# Patient Record
Sex: Male | Born: 1940 | Race: White | Hispanic: No | Marital: Married | Smoking: Former smoker
Health system: Southern US, Community
[De-identification: ages and names within clinical notes are randomized; demographics above are authoritative.]

## PROBLEM LIST (undated history)

## (undated) DIAGNOSIS — Z974 Presence of external hearing-aid: Secondary | ICD-10-CM

## (undated) DIAGNOSIS — I2510 Atherosclerotic heart disease of native coronary artery without angina pectoris: Secondary | ICD-10-CM

## (undated) DIAGNOSIS — M1990 Unspecified osteoarthritis, unspecified site: Secondary | ICD-10-CM

## (undated) DIAGNOSIS — Z872 Personal history of diseases of the skin and subcutaneous tissue: Secondary | ICD-10-CM

## (undated) DIAGNOSIS — I739 Peripheral vascular disease, unspecified: Secondary | ICD-10-CM

## (undated) DIAGNOSIS — Z9989 Dependence on other enabling machines and devices: Secondary | ICD-10-CM

## (undated) DIAGNOSIS — R351 Nocturia: Secondary | ICD-10-CM

## (undated) DIAGNOSIS — C61 Malignant neoplasm of prostate: Secondary | ICD-10-CM

## (undated) DIAGNOSIS — Z87828 Personal history of other (healed) physical injury and trauma: Secondary | ICD-10-CM

## (undated) DIAGNOSIS — R600 Localized edema: Secondary | ICD-10-CM

## (undated) DIAGNOSIS — I493 Ventricular premature depolarization: Secondary | ICD-10-CM

## (undated) DIAGNOSIS — E119 Type 2 diabetes mellitus without complications: Secondary | ICD-10-CM

## (undated) DIAGNOSIS — E039 Hypothyroidism, unspecified: Secondary | ICD-10-CM

## (undated) DIAGNOSIS — G4733 Obstructive sleep apnea (adult) (pediatric): Secondary | ICD-10-CM

## (undated) DIAGNOSIS — N320 Bladder-neck obstruction: Secondary | ICD-10-CM

## (undated) DIAGNOSIS — E785 Hyperlipidemia, unspecified: Secondary | ICD-10-CM

## (undated) DIAGNOSIS — Z973 Presence of spectacles and contact lenses: Secondary | ICD-10-CM

## (undated) DIAGNOSIS — H269 Unspecified cataract: Secondary | ICD-10-CM

## (undated) DIAGNOSIS — Z8719 Personal history of other diseases of the digestive system: Secondary | ICD-10-CM

## (undated) DIAGNOSIS — G4730 Sleep apnea, unspecified: Secondary | ICD-10-CM

## (undated) DIAGNOSIS — I10 Essential (primary) hypertension: Secondary | ICD-10-CM

## (undated) DIAGNOSIS — K219 Gastro-esophageal reflux disease without esophagitis: Secondary | ICD-10-CM

## (undated) HISTORY — PX: KNEE ARTHROSCOPY: SHX127

## (undated) HISTORY — PX: TRANSTHORACIC ECHOCARDIOGRAM: SHX275

## (undated) HISTORY — DX: Hyperlipidemia, unspecified: E78.5

## (undated) HISTORY — DX: Hypothyroidism, unspecified: E03.9

## (undated) HISTORY — PX: POLYPECTOMY: SHX149

## (undated) HISTORY — DX: Unspecified cataract: H26.9

## (undated) HISTORY — PX: CARDIOVASCULAR STRESS TEST: SHX262

## (undated) HISTORY — DX: Sleep apnea, unspecified: G47.30

## (undated) HISTORY — DX: Atherosclerotic heart disease of native coronary artery without angina pectoris: I25.10

## (undated) HISTORY — DX: Essential (primary) hypertension: I10

---

## 1993-12-30 HISTORY — PX: CARDIAC CATHETERIZATION: SHX172

## 2002-08-25 ENCOUNTER — Ambulatory Visit (HOSPITAL_COMMUNITY): Admission: RE | Admit: 2002-08-25 | Discharge: 2002-08-25 | Payer: Self-pay | Admitting: Family Medicine

## 2005-10-14 ENCOUNTER — Inpatient Hospital Stay (HOSPITAL_COMMUNITY): Admission: EM | Admit: 2005-10-14 | Discharge: 2005-11-02 | Payer: Self-pay | Admitting: Emergency Medicine

## 2005-10-21 ENCOUNTER — Ambulatory Visit: Payer: Self-pay | Admitting: Critical Care Medicine

## 2005-11-20 ENCOUNTER — Encounter: Admission: RE | Admit: 2005-11-20 | Discharge: 2005-12-29 | Payer: Self-pay | Admitting: Family Medicine

## 2005-11-29 ENCOUNTER — Inpatient Hospital Stay (HOSPITAL_COMMUNITY): Admission: EM | Admit: 2005-11-29 | Discharge: 2005-12-01 | Payer: Self-pay | Admitting: Emergency Medicine

## 2005-11-29 HISTORY — PX: OTHER SURGICAL HISTORY: SHX169

## 2005-12-02 ENCOUNTER — Ambulatory Visit: Payer: Self-pay | Admitting: Gastroenterology

## 2005-12-04 ENCOUNTER — Ambulatory Visit: Payer: Self-pay | Admitting: *Deleted

## 2005-12-11 ENCOUNTER — Encounter: Admission: RE | Admit: 2005-12-11 | Discharge: 2005-12-11 | Payer: Self-pay | Admitting: Gastroenterology

## 2005-12-27 ENCOUNTER — Encounter: Admission: RE | Admit: 2005-12-27 | Discharge: 2005-12-27 | Payer: Self-pay | Admitting: Surgery

## 2006-03-30 HISTORY — PX: CHOLECYSTECTOMY OPEN: SUR202

## 2006-07-23 ENCOUNTER — Ambulatory Visit: Payer: Self-pay | Admitting: Internal Medicine

## 2006-08-25 ENCOUNTER — Ambulatory Visit: Payer: Self-pay | Admitting: Internal Medicine

## 2006-08-28 ENCOUNTER — Ambulatory Visit (HOSPITAL_BASED_OUTPATIENT_CLINIC_OR_DEPARTMENT_OTHER): Admission: RE | Admit: 2006-08-28 | Discharge: 2006-08-28 | Payer: Self-pay | Admitting: Surgery

## 2006-08-28 ENCOUNTER — Encounter (INDEPENDENT_AMBULATORY_CARE_PROVIDER_SITE_OTHER): Payer: Self-pay | Admitting: Specialist

## 2006-09-24 ENCOUNTER — Ambulatory Visit: Payer: Self-pay | Admitting: Internal Medicine

## 2006-09-28 HISTORY — PX: OTHER SURGICAL HISTORY: SHX169

## 2006-10-24 ENCOUNTER — Ambulatory Visit: Payer: Self-pay | Admitting: Internal Medicine

## 2006-11-10 ENCOUNTER — Emergency Department (HOSPITAL_COMMUNITY): Admission: EM | Admit: 2006-11-10 | Discharge: 2006-11-10 | Payer: Self-pay | Admitting: Emergency Medicine

## 2006-11-26 ENCOUNTER — Ambulatory Visit: Payer: Self-pay | Admitting: Internal Medicine

## 2006-11-29 HISTORY — PX: ABDOMINAL HERNIA REPAIR: SHX539

## 2007-01-13 ENCOUNTER — Ambulatory Visit: Payer: Self-pay | Admitting: Internal Medicine

## 2007-02-11 ENCOUNTER — Ambulatory Visit: Payer: Self-pay | Admitting: Internal Medicine

## 2007-02-11 LAB — CONVERTED CEMR LAB
ALT: 24 units/L (ref 0–40)
AST: 20 units/L (ref 0–37)
BUN: 18 mg/dL (ref 6–23)
CO2: 28 meq/L (ref 19–32)
GFR calc Af Amer: 125 mL/min
GFR calc non Af Amer: 103 mL/min
Hgb A1c MFr Bld: 7.2 %
LDL Cholesterol: 41 mg/dL (ref 0–99)
Microalb, Ur: 0.4 mg/dL (ref 0.0–1.9)
Potassium: 4.1 meq/L (ref 3.5–5.1)
Total CHOL/HDL Ratio: 3.3
Triglycerides: 58 mg/dL (ref 0–149)

## 2007-03-03 ENCOUNTER — Ambulatory Visit: Payer: Self-pay | Admitting: Internal Medicine

## 2007-03-24 ENCOUNTER — Encounter: Admission: RE | Admit: 2007-03-24 | Discharge: 2007-03-24 | Payer: Self-pay | Admitting: Orthopedic Surgery

## 2007-03-29 DIAGNOSIS — E1165 Type 2 diabetes mellitus with hyperglycemia: Secondary | ICD-10-CM

## 2007-04-28 ENCOUNTER — Ambulatory Visit: Payer: Self-pay | Admitting: Internal Medicine

## 2007-06-01 ENCOUNTER — Ambulatory Visit: Payer: Self-pay | Admitting: Internal Medicine

## 2007-06-07 ENCOUNTER — Emergency Department (HOSPITAL_COMMUNITY): Admission: EM | Admit: 2007-06-07 | Discharge: 2007-06-07 | Payer: Self-pay | Admitting: Emergency Medicine

## 2007-10-03 ENCOUNTER — Encounter: Payer: Self-pay | Admitting: Internal Medicine

## 2007-10-03 DIAGNOSIS — I2510 Atherosclerotic heart disease of native coronary artery without angina pectoris: Secondary | ICD-10-CM | POA: Insufficient documentation

## 2007-10-03 DIAGNOSIS — E785 Hyperlipidemia, unspecified: Secondary | ICD-10-CM | POA: Insufficient documentation

## 2007-10-03 DIAGNOSIS — I10 Essential (primary) hypertension: Secondary | ICD-10-CM | POA: Insufficient documentation

## 2007-12-29 ENCOUNTER — Ambulatory Visit: Payer: Self-pay | Admitting: Internal Medicine

## 2007-12-29 LAB — CONVERTED CEMR LAB
Albumin: 3.6 g/dL (ref 3.5–5.2)
Alkaline Phosphatase: 63 units/L (ref 39–117)
BUN: 20 mg/dL (ref 6–23)
Creatinine, Ser: 0.9 mg/dL (ref 0.4–1.5)
GFR calc Af Amer: 109 mL/min
Hgb A1c MFr Bld: 9.3 % — ABNORMAL HIGH (ref 4.6–6.0)
Microalb Creat Ratio: 7.6 mg/g (ref 0.0–30.0)
Microalb, Ur: 0.6 mg/dL (ref 0.0–1.9)
PSA: 0.75 ng/mL (ref 0.10–4.00)
Potassium: 4.2 meq/L (ref 3.5–5.1)
TSH: 2.41 microintl units/mL (ref 0.35–5.50)

## 2008-01-01 ENCOUNTER — Encounter: Payer: Self-pay | Admitting: Internal Medicine

## 2008-01-19 ENCOUNTER — Ambulatory Visit: Payer: Self-pay | Admitting: Gastroenterology

## 2008-01-20 ENCOUNTER — Ambulatory Visit: Payer: Self-pay | Admitting: Cardiology

## 2008-01-20 ENCOUNTER — Inpatient Hospital Stay (HOSPITAL_COMMUNITY): Admission: EM | Admit: 2008-01-20 | Discharge: 2008-02-01 | Payer: Self-pay | Admitting: Emergency Medicine

## 2008-01-21 HISTORY — PX: OTHER SURGICAL HISTORY: SHX169

## 2008-01-22 ENCOUNTER — Encounter (INDEPENDENT_AMBULATORY_CARE_PROVIDER_SITE_OTHER): Payer: Self-pay | Admitting: General Surgery

## 2008-01-26 ENCOUNTER — Telehealth: Payer: Self-pay | Admitting: Internal Medicine

## 2008-01-27 ENCOUNTER — Ambulatory Visit: Payer: Self-pay | Admitting: Physical Medicine & Rehabilitation

## 2008-02-01 ENCOUNTER — Inpatient Hospital Stay (HOSPITAL_COMMUNITY)
Admission: RE | Admit: 2008-02-01 | Discharge: 2008-02-11 | Payer: Self-pay | Admitting: Physical Medicine & Rehabilitation

## 2008-02-11 ENCOUNTER — Encounter: Payer: Self-pay | Admitting: Internal Medicine

## 2008-02-14 ENCOUNTER — Ambulatory Visit: Payer: Self-pay | Admitting: Internal Medicine

## 2008-02-14 ENCOUNTER — Encounter: Payer: Self-pay | Admitting: Internal Medicine

## 2008-02-14 ENCOUNTER — Observation Stay (HOSPITAL_COMMUNITY): Admission: EM | Admit: 2008-02-14 | Discharge: 2008-02-15 | Payer: Self-pay | Admitting: Emergency Medicine

## 2008-02-18 ENCOUNTER — Telehealth: Payer: Self-pay | Admitting: Internal Medicine

## 2008-02-24 ENCOUNTER — Telehealth: Payer: Self-pay | Admitting: Internal Medicine

## 2008-02-24 ENCOUNTER — Ambulatory Visit: Payer: Self-pay | Admitting: Internal Medicine

## 2008-02-24 DIAGNOSIS — R0602 Shortness of breath: Secondary | ICD-10-CM

## 2008-03-25 ENCOUNTER — Encounter: Payer: Self-pay | Admitting: Internal Medicine

## 2008-04-25 ENCOUNTER — Ambulatory Visit: Payer: Self-pay | Admitting: Internal Medicine

## 2008-04-25 DIAGNOSIS — M25569 Pain in unspecified knee: Secondary | ICD-10-CM

## 2008-04-27 ENCOUNTER — Telehealth: Payer: Self-pay | Admitting: Internal Medicine

## 2008-05-06 ENCOUNTER — Encounter: Admission: RE | Admit: 2008-05-06 | Discharge: 2008-05-06 | Payer: Self-pay | Admitting: Orthopedic Surgery

## 2008-05-20 ENCOUNTER — Ambulatory Visit: Payer: Self-pay | Admitting: Internal Medicine

## 2008-05-25 ENCOUNTER — Telehealth: Payer: Self-pay | Admitting: Internal Medicine

## 2008-05-25 ENCOUNTER — Ambulatory Visit: Payer: Self-pay | Admitting: Internal Medicine

## 2008-05-25 DIAGNOSIS — R609 Edema, unspecified: Secondary | ICD-10-CM

## 2008-05-25 LAB — CONVERTED CEMR LAB
ALT: 18 units/L (ref 0–53)
AST: 14 units/L (ref 0–37)
CO2: 31 meq/L (ref 19–32)
GFR calc Af Amer: 124 mL/min
Glucose, Bld: 82 mg/dL (ref 70–99)
Microalb Creat Ratio: 13.9 mg/g (ref 0.0–30.0)
Microalb, Ur: 1.7 mg/dL (ref 0.0–1.9)
Potassium: 4 meq/L (ref 3.5–5.1)
Sodium: 138 meq/L (ref 135–145)

## 2008-05-26 ENCOUNTER — Encounter: Payer: Self-pay | Admitting: Internal Medicine

## 2008-05-26 ENCOUNTER — Ambulatory Visit: Payer: Self-pay

## 2008-05-31 ENCOUNTER — Telehealth: Payer: Self-pay | Admitting: Internal Medicine

## 2008-06-29 ENCOUNTER — Ambulatory Visit: Payer: Self-pay | Admitting: Internal Medicine

## 2008-06-29 LAB — CONVERTED CEMR LAB
BUN: 15 mg/dL (ref 6–23)
Chloride: 107 meq/L (ref 96–112)
Creatinine, Ser: 0.8 mg/dL (ref 0.4–1.5)
GFR calc non Af Amer: 103 mL/min

## 2008-07-06 ENCOUNTER — Ambulatory Visit: Payer: Self-pay | Admitting: Internal Medicine

## 2008-07-13 ENCOUNTER — Telehealth: Payer: Self-pay | Admitting: Internal Medicine

## 2008-08-19 ENCOUNTER — Encounter: Payer: Self-pay | Admitting: Internal Medicine

## 2008-08-26 ENCOUNTER — Encounter: Payer: Self-pay | Admitting: Internal Medicine
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
## 2008-08-29 ENCOUNTER — Telehealth: Payer: Self-pay | Admitting: Gastroenterology

## 2008-09-02 ENCOUNTER — Encounter: Payer: Self-pay | Admitting: Internal Medicine

## 2008-09-14 ENCOUNTER — Ambulatory Visit: Payer: Self-pay | Admitting: Gastroenterology

## 2008-09-14 ENCOUNTER — Ambulatory Visit: Payer: Self-pay | Admitting: Internal Medicine

## 2008-09-14 LAB — CONVERTED CEMR LAB
ALT: 25 units/L (ref 0–53)
AST: 22 units/L (ref 0–37)
HDL: 29.1 mg/dL — ABNORMAL LOW (ref >39.0)
Total CHOL/HDL Ratio: 5

## 2008-09-20 ENCOUNTER — Encounter: Payer: Self-pay | Admitting: Internal Medicine

## 2008-09-21 ENCOUNTER — Encounter: Payer: Self-pay | Admitting: Gastroenterology

## 2008-09-21 ENCOUNTER — Ambulatory Visit: Payer: Self-pay | Admitting: Gastroenterology

## 2008-09-21 HISTORY — PX: COLONOSCOPY: SHX174

## 2008-09-23 ENCOUNTER — Encounter: Payer: Self-pay | Admitting: Gastroenterology

## 2008-09-27 ENCOUNTER — Encounter: Payer: Self-pay | Admitting: Internal Medicine

## 2008-10-06 ENCOUNTER — Ambulatory Visit: Payer: Self-pay | Admitting: Internal Medicine

## 2008-10-06 DIAGNOSIS — N401 Enlarged prostate with lower urinary tract symptoms: Secondary | ICD-10-CM

## 2008-10-12 ENCOUNTER — Ambulatory Visit: Payer: Self-pay | Admitting: Gastroenterology

## 2008-10-19 ENCOUNTER — Encounter: Payer: Self-pay | Admitting: Internal Medicine

## 2008-11-25 ENCOUNTER — Ambulatory Visit: Payer: Self-pay | Admitting: Gastroenterology

## 2008-11-30 ENCOUNTER — Ambulatory Visit: Payer: Self-pay | Admitting: Internal Medicine

## 2008-11-30 LAB — CONVERTED CEMR LAB
BUN: 17 mg/dL (ref 6–23)
Calcium: 9.1 mg/dL (ref 8.4–10.5)
Creatinine, Ser: 0.9 mg/dL (ref 0.4–1.5)
GFR calc Af Amer: 109 mL/min
GFR calc non Af Amer: 90 mL/min
Microalb Creat Ratio: 17.1 mg/g (ref 0.0–30.0)
Potassium: 4.4 meq/L (ref 3.5–5.1)

## 2008-12-07 ENCOUNTER — Ambulatory Visit: Payer: Self-pay | Admitting: Internal Medicine

## 2008-12-22 ENCOUNTER — Encounter: Payer: Self-pay | Admitting: Internal Medicine

## 2009-03-01 ENCOUNTER — Ambulatory Visit: Payer: Self-pay | Admitting: Internal Medicine

## 2009-03-01 LAB — CONVERTED CEMR LAB
AST: 23 units/L (ref 0–37)
Calcium: 9.1 mg/dL (ref 8.4–10.5)
Creatinine, Ser: 0.9 mg/dL (ref 0.4–1.5)
GFR calc Af Amer: 108 mL/min
HDL: 21.6 mg/dL — ABNORMAL LOW (ref >39.0)
Hgb A1c MFr Bld: 9.8 % — ABNORMAL HIGH (ref 4.6–6.0)
LDL Cholesterol: 72 mg/dL (ref 0–99)
Sodium: 140 meq/L (ref 135–145)
Total CHOL/HDL Ratio: 5.6
VLDL: 28 mg/dL (ref 0–40)

## 2009-03-08 ENCOUNTER — Ambulatory Visit: Payer: Self-pay | Admitting: Internal Medicine

## 2009-03-08 DIAGNOSIS — I679 Cerebrovascular disease, unspecified: Secondary | ICD-10-CM

## 2009-03-21 ENCOUNTER — Encounter: Payer: Self-pay | Admitting: Internal Medicine

## 2009-04-19 ENCOUNTER — Ambulatory Visit: Payer: Self-pay | Admitting: Internal Medicine

## 2009-05-23 ENCOUNTER — Ambulatory Visit: Payer: Self-pay | Admitting: Internal Medicine

## 2009-05-23 ENCOUNTER — Ambulatory Visit: Payer: Self-pay | Admitting: Radiology

## 2009-05-23 ENCOUNTER — Ambulatory Visit (HOSPITAL_BASED_OUTPATIENT_CLINIC_OR_DEPARTMENT_OTHER): Admission: RE | Admit: 2009-05-23 | Discharge: 2009-05-23 | Payer: Self-pay | Admitting: Internal Medicine

## 2009-05-23 DIAGNOSIS — R05 Cough: Secondary | ICD-10-CM

## 2009-05-23 LAB — CONVERTED CEMR LAB
BUN: 18 mg/dL (ref 6–23)
CO2: 31 meq/L (ref 19–32)
Calcium: 9 mg/dL (ref 8.4–10.5)
GFR calc non Af Amer: 89.39 mL/min (ref >60)
Glucose, Bld: 243 mg/dL — ABNORMAL HIGH (ref 70–99)

## 2009-05-30 ENCOUNTER — Ambulatory Visit: Payer: Self-pay | Admitting: Internal Medicine

## 2009-05-30 ENCOUNTER — Telehealth: Payer: Self-pay | Admitting: Internal Medicine

## 2009-05-30 DIAGNOSIS — R109 Unspecified abdominal pain: Secondary | ICD-10-CM

## 2009-05-30 LAB — CONVERTED CEMR LAB
ALT: 19 units/L (ref 0–53)
AST: 23 units/L (ref 0–37)
Albumin: 3.6 g/dL (ref 3.5–5.2)
Alkaline Phosphatase: 47 units/L (ref 39–117)
BUN: 20 mg/dL (ref 6–23)
Basophils Absolute: 0 10*3/uL (ref 0.0–0.1)
CO2: 28 meq/L (ref 19–32)
Cholesterol: 127 mg/dL (ref 0–200)
Eosinophils Absolute: 0.8 10*3/uL — ABNORMAL HIGH (ref 0.0–0.7)
GFR calc non Af Amer: 64.14 mL/min (ref >60)
Glucose, Bld: 63 mg/dL — ABNORMAL LOW (ref 70–99)
HCT: 41.3 % (ref 39.0–52.0)
Lymphocytes Relative: 23.3 % (ref 12.0–46.0)
Lymphs Abs: 2.2 10*3/uL (ref 0.7–4.0)
MCHC: 34.4 g/dL (ref 30.0–36.0)
Monocytes Relative: 12.1 % — ABNORMAL HIGH (ref 3.0–12.0)
Neutro Abs: 5.3 10*3/uL (ref 1.4–7.7)
Platelets: 144 10*3/uL — ABNORMAL LOW (ref 150.0–400.0)
Potassium: 4.2 meq/L (ref 3.5–5.1)
RDW: 13.8 % (ref 11.5–14.6)
Sodium: 144 meq/L (ref 135–145)
Total Protein: 6.9 g/dL (ref 6.0–8.3)
VLDL: 32.2 mg/dL (ref 0.0–40.0)

## 2009-06-07 ENCOUNTER — Ambulatory Visit: Payer: Self-pay | Admitting: Gastroenterology

## 2009-06-07 DIAGNOSIS — K5289 Other specified noninfective gastroenteritis and colitis: Secondary | ICD-10-CM

## 2009-07-17 ENCOUNTER — Telehealth: Payer: Self-pay | Admitting: Internal Medicine

## 2009-07-24 ENCOUNTER — Ambulatory Visit: Payer: Self-pay | Admitting: Internal Medicine

## 2009-07-24 LAB — CONVERTED CEMR LAB
Hgb A1c MFr Bld: 8.9 % — ABNORMAL HIGH (ref 4.6–6.5)
Nitrite: NEGATIVE
Specific Gravity, Urine: 1.02 (ref 1.000–1.030)
Total Protein, Urine: NEGATIVE mg/dL
pH: 5.5 (ref 5.0–8.0)

## 2009-07-25 ENCOUNTER — Telehealth: Payer: Self-pay | Admitting: Internal Medicine

## 2009-07-27 ENCOUNTER — Ambulatory Visit: Payer: Self-pay | Admitting: Internal Medicine

## 2009-08-07 ENCOUNTER — Ambulatory Visit: Payer: Self-pay | Admitting: Internal Medicine

## 2009-08-07 DIAGNOSIS — L02419 Cutaneous abscess of limb, unspecified: Secondary | ICD-10-CM

## 2009-08-07 DIAGNOSIS — L03119 Cellulitis of unspecified part of limb: Secondary | ICD-10-CM

## 2009-08-23 ENCOUNTER — Ambulatory Visit (HOSPITAL_BASED_OUTPATIENT_CLINIC_OR_DEPARTMENT_OTHER): Admission: RE | Admit: 2009-08-23 | Discharge: 2009-08-23 | Payer: Self-pay | Admitting: Internal Medicine

## 2009-08-23 ENCOUNTER — Encounter: Payer: Self-pay | Admitting: Internal Medicine

## 2009-08-24 ENCOUNTER — Ambulatory Visit: Payer: Self-pay | Admitting: Internal Medicine

## 2009-08-24 LAB — CONVERTED CEMR LAB
Calcium: 8.9 mg/dL (ref 8.4–10.5)
GFR calc non Af Amer: 79.1 mL/min (ref >60)
Sodium: 138 meq/L (ref 135–145)

## 2009-08-28 ENCOUNTER — Ambulatory Visit: Payer: Self-pay | Admitting: Internal Medicine

## 2009-08-28 DIAGNOSIS — L570 Actinic keratosis: Secondary | ICD-10-CM | POA: Insufficient documentation

## 2009-09-10 ENCOUNTER — Ambulatory Visit: Payer: Self-pay | Admitting: Pulmonary Disease

## 2009-10-03 ENCOUNTER — Telehealth: Payer: Self-pay | Admitting: Internal Medicine

## 2009-10-31 ENCOUNTER — Ambulatory Visit: Payer: Self-pay | Admitting: Internal Medicine

## 2009-10-31 DIAGNOSIS — K439 Ventral hernia without obstruction or gangrene: Secondary | ICD-10-CM | POA: Insufficient documentation

## 2009-11-02 ENCOUNTER — Encounter: Payer: Self-pay | Admitting: Internal Medicine

## 2009-11-02 LAB — CONVERTED CEMR LAB
BUN: 17 mg/dL (ref 6–23)
Chloride: 105 meq/L (ref 96–112)
Creatinine, Ser: 1.02 mg/dL (ref 0.40–1.50)
Creatinine, Urine: 131.5 mg/dL
Microalb, Ur: 1.18 mg/dL (ref 0.00–1.89)

## 2009-11-03 ENCOUNTER — Encounter: Payer: Self-pay | Admitting: Internal Medicine

## 2009-11-10 ENCOUNTER — Encounter: Payer: Self-pay | Admitting: Internal Medicine

## 2009-11-14 ENCOUNTER — Encounter: Admission: RE | Admit: 2009-11-14 | Discharge: 2009-11-14 | Payer: Self-pay | Admitting: Surgery

## 2009-12-08 ENCOUNTER — Ambulatory Visit: Payer: Self-pay | Admitting: Internal Medicine

## 2009-12-25 ENCOUNTER — Telehealth (INDEPENDENT_AMBULATORY_CARE_PROVIDER_SITE_OTHER): Payer: Self-pay | Admitting: *Deleted

## 2009-12-26 ENCOUNTER — Ambulatory Visit: Payer: Self-pay | Admitting: Internal Medicine

## 2009-12-26 LAB — CONVERTED CEMR LAB
ALT: 39 units/L (ref 0–53)
AST: 31 units/L (ref 0–37)
Direct LDL: 92.9 mg/dL
TSH: 2.66 microintl units/mL (ref 0.35–5.50)
VLDL: 41.8 mg/dL — ABNORMAL HIGH (ref 0.0–40.0)

## 2010-01-01 ENCOUNTER — Ambulatory Visit: Payer: Self-pay | Admitting: Internal Medicine

## 2010-01-14 HISTORY — PX: OTHER SURGICAL HISTORY: SHX169

## 2010-01-16 ENCOUNTER — Ambulatory Visit (HOSPITAL_COMMUNITY): Admission: RE | Admit: 2010-01-16 | Discharge: 2010-01-18 | Payer: Self-pay | Admitting: Surgery

## 2010-01-17 ENCOUNTER — Telehealth: Payer: Self-pay | Admitting: Internal Medicine

## 2010-01-17 DIAGNOSIS — G4733 Obstructive sleep apnea (adult) (pediatric): Secondary | ICD-10-CM

## 2010-01-19 ENCOUNTER — Emergency Department (HOSPITAL_COMMUNITY)
Admission: EM | Admit: 2010-01-19 | Discharge: 2010-01-19 | Payer: Self-pay | Source: Home / Self Care | Admitting: Emergency Medicine

## 2010-01-29 ENCOUNTER — Ambulatory Visit: Payer: Self-pay | Admitting: Pulmonary Disease

## 2010-01-30 ENCOUNTER — Ambulatory Visit: Payer: Self-pay | Admitting: Internal Medicine

## 2010-02-01 ENCOUNTER — Encounter: Payer: Self-pay | Admitting: Internal Medicine

## 2010-02-28 ENCOUNTER — Ambulatory Visit: Payer: Self-pay | Admitting: Internal Medicine

## 2010-03-06 ENCOUNTER — Ambulatory Visit: Payer: Self-pay | Admitting: Internal Medicine

## 2010-03-06 LAB — CONVERTED CEMR LAB
Calcium: 9 mg/dL (ref 8.4–10.5)
Chloride: 106 meq/L (ref 96–112)
Creatinine, Ser: 1.2 mg/dL (ref 0.4–1.5)
GFR calc non Af Amer: 63.99 mL/min (ref >60)
Pro B Natriuretic peptide (BNP): 36 pg/mL (ref 0.0–100.0)

## 2010-03-08 ENCOUNTER — Ambulatory Visit: Payer: Self-pay | Admitting: Pulmonary Disease

## 2010-03-14 ENCOUNTER — Ambulatory Visit: Payer: Self-pay | Admitting: Internal Medicine

## 2010-04-02 ENCOUNTER — Encounter: Payer: Self-pay | Admitting: Pulmonary Disease

## 2010-04-24 ENCOUNTER — Ambulatory Visit: Payer: Self-pay | Admitting: Internal Medicine

## 2010-04-24 LAB — CONVERTED CEMR LAB
CO2: 30 meq/L (ref 19–32)
Calcium: 9.3 mg/dL (ref 8.4–10.5)
GFR calc non Af Amer: 70.72 mL/min (ref >60)
Hgb A1c MFr Bld: 8.2 % — ABNORMAL HIGH (ref 4.6–6.5)
Sodium: 142 meq/L (ref 135–145)

## 2010-04-30 ENCOUNTER — Ambulatory Visit: Payer: Self-pay | Admitting: Internal Medicine

## 2010-04-30 DIAGNOSIS — E039 Hypothyroidism, unspecified: Secondary | ICD-10-CM | POA: Insufficient documentation

## 2010-05-08 LAB — HM DIABETES EYE EXAM: HM Diabetic Eye Exam: NORMAL

## 2010-05-17 ENCOUNTER — Telehealth: Payer: Self-pay | Admitting: Internal Medicine

## 2010-05-23 ENCOUNTER — Ambulatory Visit: Payer: Self-pay | Admitting: Internal Medicine

## 2010-05-23 DIAGNOSIS — R0989 Other specified symptoms and signs involving the circulatory and respiratory systems: Secondary | ICD-10-CM | POA: Insufficient documentation

## 2010-05-31 ENCOUNTER — Encounter: Payer: Self-pay | Admitting: Internal Medicine

## 2010-06-01 ENCOUNTER — Ambulatory Visit: Payer: Self-pay

## 2010-06-01 ENCOUNTER — Encounter: Payer: Self-pay | Admitting: Internal Medicine

## 2010-06-05 ENCOUNTER — Encounter: Payer: Self-pay | Admitting: Internal Medicine

## 2010-06-08 ENCOUNTER — Telehealth: Payer: Self-pay | Admitting: Internal Medicine

## 2010-06-12 ENCOUNTER — Encounter: Payer: Self-pay | Admitting: Internal Medicine

## 2010-06-23 ENCOUNTER — Encounter: Payer: Self-pay | Admitting: Pulmonary Disease

## 2010-06-27 ENCOUNTER — Telehealth: Payer: Self-pay | Admitting: Internal Medicine

## 2010-07-09 ENCOUNTER — Telehealth: Payer: Self-pay | Admitting: Internal Medicine

## 2010-08-15 ENCOUNTER — Encounter: Payer: Self-pay | Admitting: Internal Medicine

## 2010-08-17 ENCOUNTER — Encounter: Payer: Self-pay | Admitting: Internal Medicine

## 2010-08-20 ENCOUNTER — Ambulatory Visit: Payer: Self-pay | Admitting: Internal Medicine

## 2010-08-20 LAB — CONVERTED CEMR LAB
ALT: 36 units/L (ref 0–53)
Calcium: 9.5 mg/dL (ref 8.4–10.5)
Creatinine,U: 7.9 mg/dL
GFR calc non Af Amer: 78.87 mL/min (ref >60)
Hgb A1c MFr Bld: 9.3 % — ABNORMAL HIGH (ref 4.6–6.5)
LDL Cholesterol: 86 mg/dL (ref 0–99)
Microalb Creat Ratio: 2.5 mg/g (ref 0.0–30.0)
Microalb, Ur: 0.2 mg/dL (ref 0.0–1.9)
Potassium: 4.6 meq/L (ref 3.5–5.1)
Sodium: 142 meq/L (ref 135–145)
Total CHOL/HDL Ratio: 6

## 2010-08-27 ENCOUNTER — Ambulatory Visit: Payer: Self-pay | Admitting: Internal Medicine

## 2010-08-30 ENCOUNTER — Encounter: Payer: Self-pay | Admitting: Internal Medicine

## 2010-09-04 ENCOUNTER — Telehealth: Payer: Self-pay | Admitting: Internal Medicine

## 2010-09-10 ENCOUNTER — Ambulatory Visit: Payer: Self-pay | Admitting: Pulmonary Disease

## 2010-09-24 ENCOUNTER — Ambulatory Visit: Payer: Self-pay | Admitting: Internal Medicine

## 2010-09-24 DIAGNOSIS — G5600 Carpal tunnel syndrome, unspecified upper limb: Secondary | ICD-10-CM

## 2010-11-08 ENCOUNTER — Ambulatory Visit: Payer: Self-pay | Admitting: Internal Medicine

## 2010-11-08 LAB — CONVERTED CEMR LAB
ALT: 49 units/L (ref 0–53)
AST: 36 units/L (ref 0–37)
Calcium: 9.5 mg/dL (ref 8.4–10.5)
Creatinine,U: 104.6 mg/dL
GFR calc non Af Amer: 79.73 mL/min (ref >60)
HDL: 25.4 mg/dL — ABNORMAL LOW (ref >39.00)
Hgb A1c MFr Bld: 9.6 % — ABNORMAL HIGH (ref 4.6–6.5)
Microalb Creat Ratio: 1.1 mg/g (ref 0.0–30.0)
Potassium: 4.4 meq/L (ref 3.5–5.1)
Sodium: 141 meq/L (ref 135–145)
Total CHOL/HDL Ratio: 7
Triglycerides: 259 mg/dL — ABNORMAL HIGH (ref 0.0–149.0)

## 2010-11-14 ENCOUNTER — Ambulatory Visit: Payer: Self-pay | Admitting: Internal Medicine

## 2010-11-14 DIAGNOSIS — M7710 Lateral epicondylitis, unspecified elbow: Secondary | ICD-10-CM | POA: Insufficient documentation

## 2010-12-17 ENCOUNTER — Ambulatory Visit: Payer: Self-pay | Admitting: Internal Medicine

## 2011-01-03 ENCOUNTER — Telehealth: Payer: Self-pay | Admitting: Internal Medicine

## 2011-01-08 ENCOUNTER — Ambulatory Visit
Admission: RE | Admit: 2011-01-08 | Discharge: 2011-01-08 | Payer: Self-pay | Source: Home / Self Care | Attending: Internal Medicine | Admitting: Internal Medicine

## 2011-01-08 DIAGNOSIS — H109 Unspecified conjunctivitis: Secondary | ICD-10-CM | POA: Insufficient documentation

## 2011-01-08 DIAGNOSIS — J019 Acute sinusitis, unspecified: Secondary | ICD-10-CM | POA: Insufficient documentation

## 2011-01-19 ENCOUNTER — Encounter: Payer: Self-pay | Admitting: Gastroenterology

## 2011-01-20 ENCOUNTER — Encounter: Payer: Self-pay | Admitting: Surgery

## 2011-01-23 ENCOUNTER — Encounter: Payer: Self-pay | Admitting: Internal Medicine

## 2011-01-28 ENCOUNTER — Telehealth: Payer: Self-pay | Admitting: Internal Medicine

## 2011-01-29 NOTE — Progress Notes (Signed)
Summary: please advise the wife about c pap  Phone Note Call from Patient Call back at [phone]   Caller: pt wife Call For: [name] [name]  Summary of Call: He finished surgery yesterday at 1:30 and was not awake at 6:30 Pm.  The nurses kept asking questions about sleep apenea.  Ann told them about his sleep study and from what they pulled up in the system they advised that he should never lay down flat without the c pap machine.  She would like Dr Artist Pais to pull up these records and advise.  He will need something when he goes home from the hospital.   Initial call taken by: Roselle Locus,  January 17, 2010 10:52 AM  Follow-up for Phone Call        sleep study in hosp records does not specify CPAP setting.   I suggest contacting Dr. Teddy Spike office re:  CPAP recommendations. Follow-up by: [name],  January 17, 2010 12:11 PM  Additional Follow-up for Phone Call Additional follow up Details #1::        Molly Maduro, please see flag sent to your desktop Additional Follow-up by: Barbaraann Share MD,  January 17, 2010 5:34 PM  New Problems: SLEEP APNEA, OBSTRUCTIVE (ICD-327.23)   Additional Follow-up for Phone Call Additional follow up Details #2::    Myriam Jacobson, please arrange consultation with Dr. Shelle Iron (if pt prefers [facility] location - see if Dr. Vassie Loll will see him) Follow-up by: [name],  January 18, 2010 11:47 AM  Additional Follow-up for Phone Call Additional follow up Details #3:: Details for Additional Follow-up Action Taken: Appt   Dr Shelle Iron   Jan 31   pt notified   Additional Follow-up by: Darral Dash,  January 18, 2010 12:50 PM  New Problems: SLEEP APNEA, OBSTRUCTIVE (ICD-327.23)

## 2011-01-29 NOTE — Medication Information (Signed)
Summary: [facility]  [facility]   Imported By: Lanelle Bal 06/27/2010 08:23:07  _____________________________________________________________________  External Attachment:    Type:   Image     Comment:   External Document

## 2011-01-29 NOTE — Miscellaneous (Signed)
Summary: Lantus Vial Verification  Clinical Lists Changes  Medications: Changed medication from LANTUS SOLOSTAR 100 UNIT/ML  SOLN (INSULIN GLARGINE) inject subcutaneously 60 units two times a day ( 3 Month Supply) Please supply vials to LANTUS 100 UNIT/ML SOLN (INSULIN GLARGINE) inject [place] 60 units two times a day (vials) - Signed     Current Allergies: PRIMAXIN  call  placed to patient at  [phone] patient verified that he is using Lantus Vials.Call placed to [facility] [phone],  Ref# [id number], spoke with Casimiro Needle  at [facility] medication verified.  Glendell Docker CMA  September 05, 2010 4:08 PM

## 2011-01-29 NOTE — Progress Notes (Signed)
Summary: Medication Refills  Phone Note Refill Request Message from:  Fax from Pharmacy on June 27, 2010 9:15 AM  Refills Requested: Medication #1:  TORSEMIDE 20 MG TABS 1 to 2 tabs by mouth once daily as directed.   Dosage confirmed as above?Dosage Confirmed   Brand Name Necessary? No   Supply Requested: 3 months  Medication #2:  SIMVASTATIN 20 MG TABS one by mouth qpm   Dosage confirmed as above?Dosage Confirmed   Brand Name Necessary? No   Supply Requested: 3 months  Method Requested: Electronic Next Appointment Scheduled: 08/27/10 @ 8a Dr Artist Pais Initial call taken by: Glendell Docker CMA,  June 27, 2010 9:15 AM  Follow-up for Phone Call        Rx completed in Dr. Tiajuana Amass Follow-up by: Glendell Docker CMA,  June 27, 2010 9:17 AM    Prescriptions: TORSEMIDE 20 MG TABS (TORSEMIDE) 1 to 2 tabs by mouth once daily as directed  #180 x 1   Entered by:   Glendell Docker CMA   Authorized by:   [name]   Signed by:   Glendell Docker CMA on 06/27/2010   Method used:   [facility]ed to ...       [facility]             , [place]         Ph:        Fax: [phone]   RxID:   [id number] SIMVASTATIN 20 MG TABS (SIMVASTATIN) one by mouth qpm  #90 x 1   Entered by:   Glendell Docker CMA   Authorized by:   [name]   Signed by:   Glendell Docker CMA on 06/27/2010   Method used:   Faxed to ...       [facility] ([facility])             , [place]         Ph:        Fax: [phone]   RxID:   [id number]

## 2011-01-29 NOTE — Miscellaneous (Signed)
Summary: optimal pressure 14cm.   Clinical Lists Changes  Orders: Added new Referral order of DME Referral (DME) - Signed auto shows good compliance, and optimal pressure of 14cm

## 2011-01-29 NOTE — Letter (Signed)
Summary: [facility]  [facility]   Imported By: Lanelle Bal 06/18/2010 11:10:23  _____________________________________________________________________  External Attachment:    Type:   Image     Comment:   External Document
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
## 2011-01-29 NOTE — Progress Notes (Signed)
Summary: Surgical Clearance  Phone Note Call from Patient Call back at Home Phone [phone]   Caller: Patient Summary of Call: patient is  pending  left knee replacement Dr Marciano Sequin [facility]. He states Dr Marciano Sequin  is needing to talk to Dr Artist Pais regarding this. Patient was asked if he has had an EKG done in the past year, he states that he has not had a EKG done in the past year. He was informed that he would need to schedule an office visit for surgical clearance. Patient has scheduled for 05/23/10 @ 8:30a  Initial call taken by: Glendell Docker CMA,  May 17, 2010 11:08 AM

## 2011-01-29 NOTE — Assessment & Plan Note (Signed)
Summary: rov for osa   Copy to:  Thomos Lemons Primary Provider/Referring Provider:  Thomos Lemons DO  CC:  Pt is here for a 4 week f/u appt.   Pt states he is wearing his cpap machine every night.  Approx 8 hours per night.  Pt denied any complaints with mask or pressure.  Marland Kitchen  History of Present Illness: the pt comes in for f/u of his osa.  He was started on cpap at the last visit, and feels he is doing well with the device.  He has been compliant with the device, and reports no issues with mask fit or pressure.  He feels that he is sleeping better, and has improved daytime alertness.  Medications Prior to Update: 1)  Clonidine Hcl 0.1 Mg  Tabs (Clonidine Hcl) .... Take 1 Tablet By Mouth Once A Day At Bedtime 2)  Toprol Xl 50 Mg Xr24h-Tab (Metoprolol Succinate) .... Take 1 Tablet By Mouth Once A Day 3)  Bidil 20-37.5 Mg  Tabs (Isosorb Dinitrate-Hydralazine) .... One By Mouth Two Times A Day 4)  Simvastatin 80 Mg  Tabs (Simvastatin) .... Take 1 Tab By Mouth At Bedtime 5)  Altace 10 Mg  Tabs (Ramipril) .... By Mouth Two Times A Day 6)  Lantus Solostar 100 Unit/ml  Soln (Insulin Glargine) .... Inject Subcutaneously 50 Units Two Times A Day ( 3 Month Supply) Please Supply Vials 7)  Amlodipine Besylate 10 Mg  Tabs (Amlodipine Besylate) .... Take 1 Tablet By Mouth Once A Day 8)  Voltaren 1 %  Gel (Diclofenac Sodium) .... Apply 2 Gm Tid 9)  Novolog 100 Unit/ml  Soln (Insulin Aspart) .... 40 Units 15 Mins Before Supper and 20 Units Before Lunch 10)  Levothyroxine Sodium 25 Mcg  Tabs (Levothyroxine Sodium) .... One By Mouth Qday 11)  Precision Xtra Blood Glucose  Strp (Glucose Blood) .... Use To Test Blood Sugar Three Times A Day 12)  Precision Thin Lancets  Misc (Lancets) .... Use To Test Blood Sugar Three Times A Day 13)  Bd Insulin Syringe 29g X 1/2" 0.5 Ml Misc (Insulin Syringe-Needle U-100) .... Use Fo Insulin Injection Once Daily 14)  Flomax 0.4 Mg Xr24h-Cap (Tamsulosin Hcl) .... One By Mouth Once  Daily 15)  Torsemide 20 Mg Tabs (Torsemide) .... 2 Tabs By Mouth Once Daily  Allergies (verified): 1)  Primaxin (Imipenem-Cilastatin)  Review of Systems      See HPI  Vital Signs:  Patient profile:   70 year old male Height:      68 inches Weight:      321.13 pounds BMI:     49.00 O2 Sat:      95 % on Room air Temp:     98.3 degrees F oral Pulse rate:   64 / minute BP sitting:   130 / 64  (left arm) Cuff size:   large  Vitals Entered By: Arman Filter LPN (March 08, 2010 9:27 AM)  O2 Flow:  Room air CC: Pt is here for a 4 week f/u appt.   Pt states he is wearing his cpap machine every night.  Approx 8 hours per night.  Pt denied any complaints with mask or pressure.   Comments Medications reviewed with patient Arman Filter LPN  March 08, 2010 9:27 AM    Physical Exam  General:  obese male in nad Nose:  no skin breakdown or pressure necrosis from cpap mask.   Impression & Recommendations:  Problem # 1:  SLEEP APNEA, OBSTRUCTIVE (ICD-327.23)  the pt is doing well with cpap.  He is sleeping much better, and has definite improvement in daytime alertness.  At this point, we need to optimize pressure for him, and he needs to work on weight loss.  Will arrange for his machine to be placed on auto mode for 2 weeks to optimize his pressure.  Other Orders: Est. Patient Level II (11914) DME Referral (DME)  Patient Instructions: 1)  will optimize pressure on auto mode for the next 2 weeks.  Will call with results. 2)  work on weight loss 3)  If doing well, followup with me in 6mos.
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
## 2011-01-29 NOTE — Letter (Signed)
Summary: [facility]   Imported By: Lanelle Bal 02/22/2010 12:18:58  _____________________________________________________________________  External Attachment:    Type:   Image     Comment:   External Document
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
## 2011-01-29 NOTE — Letter (Signed)
[facility]   Imported By: Esmeralda Links D'jimraou 02/26/2008 12:49:41  _____________________________________________________________________  External Attachment:    Type:   Image     Comment:   External Document

## 2011-01-29 NOTE — Miscellaneous (Signed)
Summary: Orders Update  Clinical Lists Changes  Orders: Added new Test order of Renal Artery Duplex (Renal Artery Duplex) - Signed 
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
## 2011-01-29 NOTE — Letter (Signed)
Summary: CMN  External Correspondence   Imported By: Valinda Hoar 04/02/2010 16:09:41  _____________________________________________________________________  External Attachment:    Type:   Image     Comment:   External Document

## 2011-01-29 NOTE — Letter (Signed)
Summary: [facility]   Imported By: Lanelle Bal 08/24/2010 09:57:28  _____________________________________________________________________  External Attachment:    Type:   Image     Comment:   External Document
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
## 2011-01-29 NOTE — Miscellaneous (Signed)
Summary: Eye Exam  Clinical Lists Changes  Observations: Added new observation of DMEYEEXAMNXT: 08/2011 (08/17/2010 9:07) Added new observation of DMEYEEXMRES: normal (08/15/2010 9:08) Added new observation of EYE EXAM BY: [facility] (08/15/2010 9:08) Added new observation of DIAB EYE EX: normal (08/15/2010 9:08)       Diabetes Management Exam:    Eye Exam:       Eye Exam done elsewhere          Date: 08/15/2010          Results: normal          Done by: [facility]

## 2011-01-29 NOTE — Progress Notes (Signed)
Summary: refill--Novolog  Phone Note Refill Request Message from:  Patient on September 04, 2010 4:30 PM  Refills Requested: Medication #1:  NOVOLOG 100 UNIT/ML  SOLN 40 units 15 mins before supper and 20 units before lunch   Dosage confirmed as above?Dosage Confirmed   Supply Requested: # 3 vials   Last Refilled: 01/01/2010 Pt left voice message stating that he was returning a call from Friday. Does not use pen needles. Needs refill on Novolog vials, has plenty of Lantus at present.  [name] CMA Duncan Dull)  September 04, 2010 4:32 PM   Next Appointment Scheduled: 11-14-10 Dr Artist Pais Initial call taken by: Mervin Kung CMA Duncan Dull),  September 04, 2010 4:31 PM    Prescriptions: NOVOLOG 100 UNIT/ML  SOLN (INSULIN ASPART) 40 units 15 mins before supper and 20 units before lunch  #3 month x 0   Entered by:   Mervin Kung CMA (AAMA)   Authorized by:   [name]   Signed by:   Mervin Kung CMA (AAMA) on 09/04/2010   Method used:   Electronically to        [id number] (retail)       [address]       [place], [place]  [address]       Ph: [phone] or [phone]       Fax: [phone]   RxID:   [id number]
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
## 2011-01-29 NOTE — Progress Notes (Signed)
Summary: diabetic shoes  Phone Note Call from Patient   Caller: Spouse--Ann Call For: [name] Summary of Call: Pt would like rx for diabetic shoes faxed to [facility] (fax# [phone]). They have someone that will do custom fitting. Plese advise.   Mervin Kung CMA  June 08, 2010 2:53 PM   Follow-up for Phone Call        see hand written rx Follow-up by: [name],  June 11, 2010 2:44 PM  Additional Follow-up for Phone Call Additional follow up Details #1::        also, please advise pt to stop taking current dose of simvastatin and change to new dose.  see rx Additional Follow-up by: [name],  June 11, 2010 2:50 PM    Additional Follow-up for Phone Call Additional follow up Details #2::    Pt notified that rx has been faxed to [facility] pharmacy. Also advised pt per Dr. Olegario Messier instructions. Pt voices understanding.  Mervin Kung CMA  June 11, 2010 5:32 PM   New/Updated Medications: SIMVASTATIN 20 MG TABS (SIMVASTATIN) one by mouth qpm Prescriptions: SIMVASTATIN 20 MG TABS (SIMVASTATIN) one by mouth qpm  #90 x 1   Entered and Authorized by:   [name]   Signed by:   [name] on 06/11/2010   Method used:   Electronically to        [facility] # [id number]* (retail)       [address]       [place], [place]  [address]       Ph: [phone] or [phone]       Fax: [phone]   RxID:   [id number]
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
## 2011-01-29 NOTE — Progress Notes (Signed)
Summary: Tamsulosin Refill  Phone Note Refill Request Message from:  Fax from Pharmacy on July 09, 2010 2:19 PM  Refills Requested: Medication #1:  FLOMAX 0.4 MG XR24H-CAP one by mouth once daily   Dosage confirmed as above?Dosage Confirmed   Brand Name Necessary? No   Supply Requested: 3 months   Last Refilled: 03/19/2010  Method Requested: Electronic Next Appointment Scheduled: August 27, 2010 @ 8am Dr Artist Pais Initial call taken by: Glendell Docker CMA,  July 09, 2010 2:20 PM    Prescriptions: FLOMAX 0.4 MG XR24H-CAP (TAMSULOSIN HCL) one by mouth once daily  #90 x 1   Entered by:   Glendell Docker CMA   Authorized by:   [name]   Signed by:   Glendell Docker CMA on 07/09/2010   Method used:   Electronically to        [id number]* (retail)       [address]       [place], [place]  [address]       Ph: [phone] or [phone]       Fax: [phone]   RxID:   [id number]
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
## 2011-01-31 NOTE — Progress Notes (Signed)
Summary: refill requests  Phone Note Call from Patient   Caller: Patient Call For: [name] Summary of Call: Pt left paper at front desk requesting refills on Clonidine, toprol, Bidil, simvastatin, altace, amlodipine, levothyroxine, apidra and torsemide be sent to [facility] mail order.  Spoke to [facility] at [employer] and verified that pt still has refills on all meds except Apidra and Torsemide. All other meds were filled 08/27/10 with 3 month supply and 3 refills on each.  Apidra was refilled on 12/17/10 to [facility] [address] x 3 refills. Does pt want to get Apidra through mail order?  Torsemide refills to [facility] have expired. Left message on machine for pt to return my call. [name] CMA Duncan Dull)  January 03, 2011 11:07 AM   Follow-up for Phone Call        Pt returned my call and was notified that all rxs have refills except Torsemide. Pt verified he is getting Apidra from local pharmacy and was advised that he still has refills on file. Pt states he was not able to pull up his account with [facility] on line. Advised pt to call and speak with a representative.  Please advise if it is ok to refill Torsemide.  [name] CMA Duncan Dull)  January 03, 2011 12:02 PM   Additional Follow-up for Phone Call Additional follow up Details #1::        ok to refill toresemide for [facility] Additional Follow-up by: [name],  January 03, 2011 5:04 PM    Prescriptions: TORSEMIDE 20 MG TABS (TORSEMIDE) 1 to 2 tabs by mouth once daily as directed  #180 x 1   Entered by:   Mervin Kung CMA (AAMA)   Authorized by:   [name]   Signed by:   [name] on 01/03/2011   Method used:   Faxed to ...       [facility]mail[facility]order)             , [place]         Ph:        Fax: [phone]   RxID:   [id number]   Current Allergies: PRIMAXIN
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
## 2011-02-06 NOTE — Progress Notes (Signed)
Summary: Diabetic Test Strips  Phone Note Call from Patient Call back at Home Phone [phone]   Caller: Patient Call For: [name] Summary of Call: patient called and left voice message requesting a rx for True Test test strips to [facility].  call returned to patient at [phone], no answer. A detailed message was left for patient to return call. Clarification is needed on what glucosed machine patient is currently using, and who his diabetic supplies are being provided by. Initial call taken by: Glendell Docker CMA,  January 28, 2011 4:03 PM  Follow-up for Phone Call        patient returned phone call and states that he is no longer using the precision meter. It wore out on him, and he replaced it with a True Test meter Follow-up by: Glendell Docker CMA,  January 28, 2011 4:38 PM    New/Updated Medications: TRUETEST TEST  STRP (GLUCOSE BLOOD) use to check blood sugar two times a day (250.02) Prescriptions: TRUETEST TEST  STRP (GLUCOSE BLOOD) use to check blood sugar two times a day (250.02)  #100 x 3   Entered by:   Glendell Docker CMA   Authorized by:   [name]   Signed by:   Glendell Docker CMA on 01/28/2011   Method used:   Electronically to        [id number]* (retail)       [address]       [place], [place]  [address]       Ph: [phone] or [phone]       Fax: [phone]   RxID:   [id number]

## 2011-02-13 ENCOUNTER — Encounter: Payer: Self-pay | Admitting: Pulmonary Disease

## 2011-02-26 NOTE — Letter (Signed)
Summary: CMN for PAP/[employer]  CMN for [facility]/[employer]   Imported By: Sherian Rein 02/20/2011 12:07:35  _____________________________________________________________________  External Attachment:    Type:   Image     Comment:   External Document

## 2011-03-11 ENCOUNTER — Other Ambulatory Visit: Payer: PRIVATE HEALTH INSURANCE

## 2011-03-11 ENCOUNTER — Other Ambulatory Visit: Payer: Self-pay | Admitting: Internal Medicine

## 2011-03-11 ENCOUNTER — Encounter (INDEPENDENT_AMBULATORY_CARE_PROVIDER_SITE_OTHER): Payer: Self-pay | Admitting: *Deleted

## 2011-03-11 DIAGNOSIS — E039 Hypothyroidism, unspecified: Secondary | ICD-10-CM

## 2011-03-11 DIAGNOSIS — E1165 Type 2 diabetes mellitus with hyperglycemia: Secondary | ICD-10-CM

## 2011-03-11 LAB — BASIC METABOLIC PANEL
CO2: 26 mEq/L (ref 19–32)
Chloride: 105 mEq/L (ref 96–112)
Sodium: 137 mEq/L (ref 135–145)

## 2011-03-11 LAB — HEMOGLOBIN A1C: Hgb A1c MFr Bld: 9.1 % — ABNORMAL HIGH (ref 4.6–6.5)

## 2011-03-17 LAB — GLUCOSE, CAPILLARY
Glucose-Capillary: 205 mg/dL — ABNORMAL HIGH (ref 70–99)
Glucose-Capillary: 209 mg/dL — ABNORMAL HIGH (ref 70–99)
Glucose-Capillary: 224 mg/dL — ABNORMAL HIGH (ref 70–99)
Glucose-Capillary: 249 mg/dL — ABNORMAL HIGH (ref 70–99)
Glucose-Capillary: 272 mg/dL — ABNORMAL HIGH (ref 70–99)
Glucose-Capillary: 295 mg/dL — ABNORMAL HIGH (ref 70–99)
Glucose-Capillary: 343 mg/dL — ABNORMAL HIGH (ref 70–99)

## 2011-03-17 LAB — COMPREHENSIVE METABOLIC PANEL
Albumin: 4 g/dL (ref 3.5–5.2)
Alkaline Phosphatase: 57 U/L (ref 39–117)
BUN: 21 mg/dL (ref 6–23)
Chloride: 102 mEq/L (ref 96–112)
Glucose, Bld: 335 mg/dL — ABNORMAL HIGH (ref 70–99)
Potassium: 4.8 mEq/L (ref 3.5–5.1)
Total Bilirubin: 0.7 mg/dL (ref 0.3–1.2)

## 2011-03-17 LAB — DIFFERENTIAL
Basophils Absolute: 0 10*3/uL (ref 0.0–0.1)
Basophils Absolute: 0 10*3/uL (ref 0.0–0.1)
Basophils Relative: 1 % (ref 0–1)
Eosinophils Absolute: 0.5 10*3/uL (ref 0.0–0.7)
Eosinophils Relative: 6 % — ABNORMAL HIGH (ref 0–5)
Lymphocytes Relative: 12 % (ref 12–46)
Lymphs Abs: 0.9 10*3/uL (ref 0.7–4.0)
Monocytes Absolute: 0.5 10*3/uL (ref 0.1–1.0)
Neutro Abs: 3.8 10*3/uL (ref 1.7–7.7)
Neutrophils Relative %: 61 % (ref 43–77)
Neutrophils Relative %: 73 % (ref 43–77)

## 2011-03-17 LAB — D-DIMER, QUANTITATIVE: D-Dimer, Quant: 2.43 ug/mL-FEU — ABNORMAL HIGH (ref 0.00–0.48)

## 2011-03-17 LAB — CBC
HCT: 42.8 % (ref 39.0–52.0)
HCT: 41.3 % (ref 39.0–52.0)
Hemoglobin: 14.4 g/dL (ref 13.0–17.0)
Platelets: 126 10*3/uL — ABNORMAL LOW (ref 150–400)
RBC: 4.94 MIL/uL (ref 4.22–5.81)
RDW: 14.8 % (ref 11.5–15.5)
WBC: 6.3 10*3/uL (ref 4.0–10.5)
WBC: 7.3 10*3/uL (ref 4.0–10.5)

## 2011-03-17 LAB — POCT CARDIAC MARKERS
CKMB, poc: 1.9 ng/mL (ref 1.0–8.0)
CKMB, poc: 2.1 ng/mL (ref 1.0–8.0)
Troponin i, poc: <0.05 ng/mL (ref 0.00–0.09)
Troponin i, poc: <0.05 ng/mL (ref 0.00–0.09)

## 2011-03-17 LAB — BASIC METABOLIC PANEL
BUN: 10 mg/dL (ref 6–23)
Calcium: 9 mg/dL (ref 8.4–10.5)
GFR calc non Af Amer: >60 mL/min (ref >60)
Potassium: 4.5 mEq/L (ref 3.5–5.1)

## 2011-03-18 ENCOUNTER — Encounter: Payer: Self-pay | Admitting: Internal Medicine

## 2011-03-18 ENCOUNTER — Ambulatory Visit (INDEPENDENT_AMBULATORY_CARE_PROVIDER_SITE_OTHER): Payer: Medicare FFS | Admitting: Internal Medicine

## 2011-03-18 DIAGNOSIS — I10 Essential (primary) hypertension: Secondary | ICD-10-CM

## 2011-03-18 DIAGNOSIS — M25569 Pain in unspecified knee: Secondary | ICD-10-CM
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
## 2011-04-11 ENCOUNTER — Telehealth: Payer: Self-pay | Admitting: Internal Medicine

## 2011-04-11 DIAGNOSIS — K5289 Other specified noninfective gastroenteritis and colitis: Secondary | ICD-10-CM

## 2011-04-11 MED ORDER — COLESEVELAM HCL 625 MG PO TABS: 1250.0000 mg | ORAL_TABLET | Freq: Two times a day (BID) | ORAL | Status: DC

## 2011-04-11 NOTE — Telephone Encounter (Signed)
rx refill sent to pharmacy 

## 2011-04-11 NOTE — Telephone Encounter (Signed)
Refill- welchol 625mg  tablet. Take 2 tablets by mouth twice a day. Qty 120. Last fill 2.15.12
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
## 2011-05-14 NOTE — Procedures (Signed)
[name] [name], [name] [name] NO.:  [id number]   MEDICAL RECORD NO.:  [id number]          PATIENT TYPE:  OUT   LOCATION:  SLEEP CENTER                 FACILITY[facility]   PHYSICIAN:  Barbaraann Share, MD,FCCPDATE OF BIRTH:  10-[birth year]-42   DATE OF STUDY:  08/23/2009                            NOCTURNAL POLYSOMNOGRAM   REFERRING PHYSICIAN:  Barbette Hair. Artist Pais, DO   REFERRING PHYSICIAN:  Barbette Hair. Artist Pais, DO   INDICATION FOR STUDY:  Hypersomnia with sleep apnea.   EPWORTH SLEEPINESS SCORE:  Eleven.   MEDICATIONS:   SLEEP ARCHITECTURE:  The patient had total sleep time of only 167  minutes with no slow-wave sleep or REM.  Sleep onset latency was normal  at 21 minutes and sleep efficiency was extremely poor at 39%.   RESPIRATORY DATA:  The patient was found to have 244 apneas that  consisted of both obstructive, central, and mixed types.  He was also  found to have 32 obstructive hypopneas.  This gave him an apnea-hypopnea  index of 99 events per hour.  The events occurred in all body positions,  and there was moderate to loud snoring noted throughout.   OXYGEN DATA:  There was O2 desaturation as low as 85% with the patient's  obstructive events.   CARDIAC DATA:  Occasional PVCs noted, but no clinically significant  arrhythmia seen.   MOVEMENT-PARASOMNIA:  The patient had no significant leg jerks or  abnormal behaviors.   IMPRESSIONS-RECOMMENDATIONS:  1. 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  Treatment for this degree of sleep apnea should focus      primarily on      continuous positive airway pressure as well as weight loss.  2. Occasional premature ventricular contractions seen, but no      clinically significant arrhythmia noted.      Barbaraann Share, MD,FCCP  Diplomate, American Board of Sleep  Medicine  Electronically Signed     KMC/MEDQ  D:   09/10/2009 16:31:07  T:  09/11/2009 04:38:19  Job:  [id number]

## 2011-05-14 NOTE — Assessment & Plan Note (Signed)
[facility]                         GASTROENTEROLOGY OFFICE NOTE   [name], [name]                         MRN:          [id number]  DATE:01/19/2008                            DOB:          Apr 30, 1941    CHIEF COMPLAINT:  A 70 year old white male presenting for colorectal  cancer screening.   HISTORY OF PRESENT ILLNESS:  [name] [name] has a history of severe acute  necrotizing pancreatitis with pseudocyst formation in 2006.  He is  status post pancreatic necrosectomy, cholecystectomy and external drain  placement by Dr. Marilynn Rail at [facility] in April of 2007 for an infected pseudocyst.  A ventral hernia  formed at the site of the external drain and he is status post ventral  hernia repair by Dr. Marilynn Rail in December of 2007.  For the past 4-5  months he has noted a small amount of drainage from the previous  external drain placement site.  He states it drains a small amount each  day.  He has not had fevers, chills or abdominal pain.  He was in the  emergency room in June of 2008, for abdominal pain and swelling and an  abdominal and pelvic CT scan showed induration and skin thickening in  the right mid abdomen at the level of the umbilicus as well as a 3.6 x  2.0 cm pancreatic pseudocyst.  Scattered diverticulosis was also noted.  The patient states he has a follow up appointment on Thursday with Dr.  Marilynn Rail.  He has no colorectal complaints and specifically denies any  change in bowel habits, melena, hematochezia or change in stool caliber.  There is no family history of colon cancer, colon polyps or inflammatory  bowel disease.   CURRENT MEDICATIONS:  Listed on the chart, updated and reviewed.   MEDICATION ALLERGIES:  PRIMAXIN, LEADING TO A RASH.   PHYSICAL EXAMINATION:  An obese white male in no acute distress.  Weight  279.6 pounds.  Blood pressure is 140/64, pulse 72 and regular.  HEENT:  Anicteric  sclera, oropharynx clear.  CHEST:  Clear to auscultation bilaterally.  CARDIAC:  Regular rate and rhythm without murmurs appreciated.  ABDOMEN:  Large, soft, nontender.  Normoactive bowel sounds.  No  palpation organomegaly, masses or hernias.  He has a well-healed midline  incision.  In his right mid abdomen, at approximately the level of the  umbilicus, there is an incision that he relates was the site of his  ventral hernia repair and prior external drain site which has erythema  and induration, I was not able to express any drainage today with  palpation.  RECTAL:  Deferred to time of colonoscopy.  EXTREMITIES:  Without clubbing, cyanosis, edema.  NEUROLOGIC:  Alert and oriented x3, grossly nonfocal.   ASSESSMENT AND PLAN:  1. Average risk for colorectal cancer. Insulin-dependent diabetes      mellitus.  The risks, benefits and alternatives to colonoscopy with      possible biopsy and possible polypectomy discussed with the patient      and he consents to proceed, this will be  scheduled electively.      Adjustments to his insulin regimen will be made for his bowel prep      and procedure day.  2. History of necrotizing pancreatitis status post pancreatic      necrosectomy, cholecystectomy and external drain placement in      April, 2007, status post ventral hernia repair in December, 2007.      He has drainage from his ventral hernia repair incision.  A 3.6 by      2.0 cm pseudocyst was noted on his CT scan from June, 2008.  Follow      up with Dr. Marilynn Rail.  If a followup imaging study of the abdomen      is not obtained by Dr. Marilynn Rail we will plan to schedule this study      when he returns for colonoscopy.     [name] [name]. [name] Dar, MD, [facility]  Electronically Signed    MTS/MedQ  DD: 01/19/2008  DT: 01/19/2008  Job #: [id number]   cc:   Barbette Hair. Artist Pais, DO
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
## 2011-05-16 ENCOUNTER — Ambulatory Visit: Payer: PRIVATE HEALTH INSURANCE | Admitting: Internal Medicine

## 2011-05-16 ENCOUNTER — Encounter: Payer: Self-pay | Admitting: Internal Medicine

## 2011-05-16 DIAGNOSIS — E1165 Type 2 diabetes mellitus with hyperglycemia: Secondary | ICD-10-CM

## 2011-05-16 DIAGNOSIS — I10 Essential (primary) hypertension: Secondary | ICD-10-CM

## 2011-05-16 DIAGNOSIS — IMO0001 Reserved for inherently not codable concepts without codable children: Secondary | ICD-10-CM

## 2011-05-16 MED ORDER — TORSEMIDE 20 MG PO TABS: 20.0000 mg | ORAL_TABLET | Freq: Every day | ORAL | Status: DC

## 2011-05-16 NOTE — Patient Instructions (Signed)
Please bring your glucometer to your next office visit

## 2011-05-16 NOTE — Progress Notes (Signed)
Subjective:    Patient ID: [name] [name], male    DOB: 11/03/1941, 70 y.o.   MRN: [id number]  HPI  70 y/o male for follow up re:  DM II.  He still occ forgets to take his insulin.  Pt also has been having intermittent hypoglycemia.  He took both lantus and apidra this AM w/o eating breakfast.   Review of Systems  Past Medical History  Diagnosis Date  . Diabetes mellitus   . Hypertension   . Hyperlipidemia   . Coronary artery disease   . Morbid obesity   . Hypothyroidism   . Obstructive sleep apnea     History   Social History  . Marital Status: Married    Spouse Name: N/A    Number of Children: N/A  . Years of Education: N/A   Occupational History  . Not on file.   Social History Main Topics  . Smoking status: Former Games developer  . Smokeless tobacco: Not on file   Comment: quit 15 years ago-smoked over 30 years  . Alcohol Use: Not on file  . Drug Use: Not on file  . Sexually Active: Not on file   Other Topics Concern  . Not on file   Social History Narrative  . No narrative on file    Past Surgical History  Procedure Date  . Knee arthroscopy   . Knee surgery RIGHT KNEE   . Cholecystectomy 03/2006  . Hernia repair 11/2006    12/2009  . Hand surgery 2009    RIGHT HAND    No family history on file.  No Known Allergies  Current Outpatient Prescriptions on File Prior to Visit  Medication Sig Dispense Refill  . amLODipine (NORVASC) 10 MG tablet Take 10 mg by mouth daily.        . cloNIDine (CATAPRES) 0.1 MG tablet Take 0.1 mg by mouth at bedtime.        . colesevelam (WELCHOL) 625 MG tablet Take 2 tablets (1,250 mg total) by mouth 2 (two) times daily with a meal. 2 tablets by mouth 2 times daily  120 tablet  1  . diclofenac sodium (VOLTAREN) 1 % GEL Apply 2 g topically 3 (three) times daily.        . INS SYRINGE/NEEDLE .5CC/29G (B-D INSULIN SYRINGE) 29G X 1/2" 0.5 ML MISC by Does not apply route. Use for insulin injection once daily       . insulin glargine  (LANTUS) 100 UNIT/ML injection Inject 60 Units into the skin 2 (two) times daily.        . insulin glulisine (APIDRA) 100 UNIT/ML injection Inject 40-50 Units into the skin 2 (two) times daily before a meal.        . isosorbide-hydrALAZINE (BIDIL) 20-37.5 MG per tablet Take 1 tablet by mouth 2 (two) times daily.        Marland Kitchen levothyroxine (LEVOTHROID) 25 MCG tablet Take 25 mcg by mouth daily.        . metFORMIN (GLUCOPHAGE-XR) 500 MG 24 hr tablet Take 500 mg by mouth 2 (two) times daily.        . metoprolol (TOPROL XL) 50 MG 24 hr tablet Take 50 mg by mouth daily.        . Precision Thin Lancets MISC by Does not apply route. Use to check blood sugar three times daily       . ramipril (ALTACE) 10 MG tablet Take 10 mg by mouth 2 (two) times daily.        Marland Kitchen  simvastatin (ZOCOR) 20 MG tablet Take 20 mg by mouth at bedtime.        . Tamsulosin HCl (FLOMAX) 0.4 MG CAPS Take 0.4 mg by mouth daily.        Marland Kitchen torsemide (DEMADEX) 20 MG tablet Take 20 mg by mouth daily.          BP 124/60  Pulse 65  Temp(Src) 98.2 F (36.8 C) (Oral)  Resp 22  Wt 299 lb (135.626 kg)  SpO2 94%       Objective:   Physical Exam  Constitutional: PleasantNo distress.  Cardiovascular: Normal rate, regular rhythm and normal heart sounds.  Exam reveals no gallop and no friction rub.  No murmur heard. Pulmonary/Chest: Effort normal and breath sounds normal.  No wheezes. No rales.  Neurological: Alert. No cranial nerve deficit.  Psychiatric: Normal mood and affect. Behavior is normal.      Assessment & Plan:

## 2011-05-17 ENCOUNTER — Encounter: Payer: Self-pay | Admitting: Internal Medicine

## 2011-05-17 LAB — BASIC METABOLIC PANEL WITH GFR
BUN: 20 mg/dL (ref 6–23)
CO2: 23 mEq/L (ref 19–32)
Calcium: 9.4 mg/dL (ref 8.4–10.5)
Creat: 1.04 mg/dL (ref 0.40–1.50)
GFR, Est African American: >60 mL/min (ref >60)
Glucose, Bld: 182 mg/dL — ABNORMAL HIGH (ref 70–99)
Sodium: 141 mEq/L (ref 135–145)

## 2011-05-17 NOTE — Discharge Summary (Signed)
[name] [name], [name] [name]                  ACCOUNT NO.:  [id number]   MEDICAL RECORD NO.:  [id number]          PATIENT TYPE:  INP   LOCATION:  6702                         FACILITY:  [facility]   PHYSICIAN:  Jonna L. Robb Matar, M.D.DATE OF BIRTH:  [birth year]   DATE OF ADMISSION:  10/14/2005  DATE OF DISCHARGE:  11/02/2005                                 DISCHARGE SUMMARY   PRIMARY CARE PHYSICIAN:  Sharlot Gowda, M.D.   CONSULTATIONS:  1.  Dr. Corliss Skains for surgery.  2.  Critical care medicine.   FINAL DIAGNOSES:  1.  Severe necrotizing pancreatitis.  2.  Poorly controlled diabetes.  3.  Hyperlipidemia which is the probable cause of the pancreatitis.  4.  Hypertension.  5.  Hypoxic respiratory failure.  6.  Protein calorie malnutrition.  7.  Morbid obesity.  8.  History of coronary artery disease.  9.  Essential hypertension.  10. ____________ secondary to imipenem.  11. Anemia of chronic disease.   For overall details, see the interim discharge summaries of October 25, 2005  and October 29, 2005. Since that time, the patient has been taught how to  give himself insulin and he has been well controlled once his Lantus dose  was increased. Amylase and lipase were back to normal.   LABORATORY DATA:  Initial white count 25.7; final white count 11.3. Initial  hemoglobin 15.6; final 9.7. Initial albumin 3.9; final 1.9. Initial lipase  was over 2000; final was 28. Initial A1c was 8.4. Cholesterol 139,  triglycerides 99, HDL 13, LDL 106. Triglycerides got as high as 178. TSH  normal. Iron low at 23.   DISPOSITION:  The patient will be discharged on the following medications:  Cipro 250 milligrams b.i.d. for four days, Flagyl 500 milligrams b.i.d. for  four days, Clonidine 0.2 milligrams at h.s., Toprol XL 50 milligrams daily,  Altace daily (he has a previous dose at home), BiDil 20/37.5 milligrams  b.i.d., Reglan 5 milligrams a.c. and h.s.,  omeprazole 20 milligrams daily, Lantus 20 units daily. He  is to see Dr.  Susann Givens in one week. He is to get some outpatient diabetic education. He is  to be on a low salt, low fat diet. He is not to ever drink alcohol or take  hydrochlorothiazide. We will need to keep tight control of his diabetes.      Jonna L. Robb Matar, M.D.  Electronically Signed     JLB/MEDQ  D:  11/02/2005  T:  11/02/2005  Job:  [id number]   cc:   Sharlot Gowda, M.D.  Fax: [phone]

## 2011-05-17 NOTE — Discharge Summary (Signed)
NAMEFONG, MCCARRY NO.:  [id number]   MEDICAL RECORD NO.:  [id number]          PATIENT TYPE:  INP   LOCATION:  2928                         FACILITY:  [facility]   PHYSICIAN:  Lonia Blood, M.D.       DATE OF BIRTH:  Jun 05, 1941   DATE OF ADMISSION:  10/14/2005  DATE OF DISCHARGE:                                 DISCHARGE SUMMARY   INTERIM DISCHARGE SUMMARY   DIAGNOSES:  1.  Severe necrotizing pancreatitis.  2.  Pseudocyst.  3.  Diabetes mellitus.  4.  Hypertension.  5.  Obesity.  6.  Hyperlipidemia.  7.  Hypokalemia, currently under treatment with intravenous potassium.  8.  Hypoxic respiratory failure secondary to severe abdominal pain,      decreased abdominal compliance, bilateral small pleural effusions, and      atelectasis, resolved.   DISCHARGE MEDICATIONS:  To be dictated at the time of discharge.   PROCEDURES DURING THIS ADMISSION:  1.  On October 14, 2005, the patient underwent an abdominal CT with contrast      that showed acute edematous pancreatitis, no necrosis.  2.  On October 19, 2005, the patient underwent a percutaneously-inserted      central catheter placement.  3.  On October 20, 2005, the patient underwent a computed tomography scan of      the abdomen that shows prominent amount of fluid and edema tracking      along the greater curvature of the stomach into the root of the small      bowel and mesentery, peripancreatic inflammatory changes.  4.  On October 23, 2005, the patient underwent computed tomography scan of      the abdomen that showed necrotic pancreatitis, developing pseudocyst      along the greater curvature of the stomach, small bilateral pleural      effusions, and a right adrenal nodule.   CONSULTATIONS DURING THIS ADMISSION:  1.  Dr. Sarina Ser from critical care medicine.  2.  Dr. Marcille Blanco from surgery.   CONDITION AT DISCHARGE:  To be dictated at time of discharge.   ADMISSION HISTORY AND PHYSICAL:  For  full admission H&P please refer to  dictated H&P done by Dr. Jetty Duhamel on October 14, 2005. Briefly, Mr.  Alkhatib is a 70 year old man with the medical history of diabetes,  hyperlipidemia, hypertension, who presented to [facility] emergency room for  abrupt onset of abdominal pain. He was evaluated and diagnosed with acute  pancreatitis, and he was admitted to the hospital for further treatment.  This dictation covers the hospital course from October 14, 2005, until  October 23, 2005.   HOSPITAL COURSE:  #1 - ACUTE PANCREATITIS. Mr. Hunley was admitted to the  regular floor to [facility]. He was placed on intravenous fluids  and he was placed n.p.o. The cause for his pancreatitis remained obscured as  the patient does not have any gallstones and no significant cholecystitis,  and his triglycerides are within normal limits. Also at home, Mr. Messer was  not on any medications known to  cause pancreatitis. The amount of alcohol  consumption for Mr. Badeaux would not by any means support a diagnosis of  alcohol pancreatitis. At this point it was felt that Mr. Devincent pancreatitis  is idiopathic. On hospital day #3, Mr. Plotts reported increased severity of  his abdominal pain with difficulty breathing. The patient was transferred to  the step-down unit at [facility] where he was placed on Venturi  face mask and on Dilaudid PCA pump. His abdomen was re-scanned and the  computed tomography scan showed extensive peripancreatic fluid with necrosis  of the pancreas. At this point, Mr. Molstad was started on intravenous  antibiotics with imipenem and he was started on parenteral nutrition through  a PICC line. His narcotic requirement decreased. As of October 23, 2005, he  was able not to use any narcotics at this point. His abdominal pain improved  and he was without ileus or without nausea and vomiting. At this point, the  main problem with his pancreatitis is the development of a  pseudocyst and  there are plans currently to evaluate with interventional radiology and  gastroenterology for drainage.   #2 - DIABETES MELLITUS. Upon admission, Mr. Devery was started on subcutaneous  insulin. As his pancreatitis worsened on hospital day #5, Mr. Senger was  started on intravenous insulin and the dose will be adjusted according to  the Glucommander. As of the time of this dictation, the patient was still on  intravenous insulin.   #3 - HYPOXIA. On hospital day #3 when the pancreatitis worsened, the patient  developed hypoxia. Pulmonary critical care consultation was sought and Dr.  Jayme Cloud felt that the hypoxia was not related to ARDS but related to  decreased respiratory compliance due to increased abdominal girth and pain.  Also, another cause for hypoxia was atelectasis and small bilateral  effusions. With better pain control Mr. Mika respiratory function improved  and he was able to be titrated off from a Venturi mask to oxygen per nasal  cannula on 2 L continuously.   #4 - HYPERTENSION. While n.p.o., Mr. Langan blood pressure was controlled on  escalating dose of metoprolol and hydralazine, and he is continuing those at  the time of this dictation.   #5 - HYPOKALEMIA. This was probably related to significant pancreatitis that  the patient had, and the patient receives continuous replacement for his  potassium.      Lonia Blood, M.D.  Electronically Signed     SL/MEDQ  D:  10/25/2005  T:  10/26/2005  Job:  [id number]   cc:   Sharlot Gowda, M.D.  Fax: [phone]   Danice Goltz, M.D. [facility]  [address] [place], [place] [address]   Wilmon Arms. Corliss Skains, M.D.  [address] [place] [address] [id number]  [place]

## 2011-05-17 NOTE — Op Note (Signed)
NAMEWINDEL, [name] NO.:  [id number]   MEDICAL RECORD NO.:  [id number]          PATIENT TYPE:  INP   LOCATION:  5739                         FACILITY:  [facility]   PHYSICIAN:  Adolph Pollack, M.D.DATE OF BIRTH:  1941/11/05   DATE OF PROCEDURE:  11/29/2005  DATE OF DISCHARGE:  12/01/2005                                 OPERATIVE REPORT   PREOPERATIVE DIAGNOSIS:  Left anorectal abscess.   POSTOPERATIVE DIAGNOSIS:  Left anorectal abscess.   PROCEDURE:  Complex incision, drainage, and debridement of anorectal  abscess.   SURGEON:  Adolph Pollack, MD   ANESTHESIA:  General.   INDICATIONS:  This is a 70 year old male with diabetes mellitus. He said he  had a small little swollen area in the left perianal region that began the  day after Thanksgiving. He was started on oral antibiotics; however, the  area continued to grow and become more uncomfortable. He was seen by Dr.  Marcille Blanco and admitted from our office to have formal incision and  drainage.  The procedure and the risks included but were not limited to  bleeding, recurrent operations needed, anesthesia, and potential  incontinence were explained to him.   TECHNIQUE:  He was seen in the holding area, brought to the operating room,  and placed supine on the operating room. General anesthetic was  administered. He was placed in a lithotomy position. The perianal area was  sterilely prepped and draped. There was a fluctuant area in the left  perianal region laterally that was draining some purulent material. I made a  large elliptical incision through full-thickness skin through subcutaneous  tissue, excising some of this indurated and fluctuant tissue, and a large  amount of purulent material was evacuated. Some of it was sent for culture.  I then debrided some of the indurated area sharply leaving an elliptical  type defect. It did track deep into the left perirectal space. Following  this, I  controlled bleeding with electrocautery. I then placed a 1/2-inch  Penrose drain to the deep left perirectal space and anchored it internally  to the subcutaneous tissue and then to the skin with a chromic suture. Moist  Kerlix gauze was used to pack the wound followed by a bulky dry dressing.   He tolerated the procedure well without any apparent complications and was  taken to the recovery room in satisfactory condition.      Adolph Pollack, M.D.  Electronically Signed    TJR/MEDQ  D:  11/29/2005  T:  12/02/2005  Job:  [id number]   cc:   Vale Haven. Andrey Campanile, M.D.  Fax: [phone]

## 2011-05-17 NOTE — Op Note (Signed)
[name] [name], [name] [name]                  ACCOUNT NO.:  [id number]   MEDICAL RECORD NO.:  [id number]          PATIENT TYPE:  AMB   LOCATION:  [facility]                         FACILITY:  [facility]   PHYSICIAN:  Wilmon Arms. Corliss Skains, M.D. DATE OF BIRTH:  [birth year]   DATE OF PROCEDURE:  DATE OF DISCHARGE:                                 OPERATIVE REPORT   PREOPERATIVE DIAGNOSIS:  Bilateral breast mass.   POSTOPERATIVE DIAGNOSIS:  Bilateral breast mass.   PROCEDURE PERFORMED:  Bilateral breast biopsies.   SURGEON:  Wilmon Arms. Tsuei, M.D.   ANESTHESIA:  General via laryngeal mask airway.   INDICATIONS:  The patient is a 70 year old male who has had multiple medical  issues recently.  However, over the last couple months he has noticed  bilateral breast tenderness.  He felt a lump in his right breast.  He has  undergone bilateral mammograms this showed bilateral gynecomastia, asymmetry  noted on the right.  Due to the palpable mass he was referred for surgical  evaluation.   DESCRIPTION OF PROCEDURE:  The patient brought to the operating room, placed  in supine position operating table.  After an adequate level of general  anesthesia was obtained the patient was tilted slightly to his left.  His  entire chest was prepped with Betadine and draped in sterile fashion.  The  area around his right nipple was anesthetized with 0.25% Marcaine.  A  transverse incision was made below the nipple.  We raised skin flaps  inferiorly as well as superiorly.  We continued our dissection with cautery  superiorly until we were above the nipple.  We then grasped the breast  tissue with an Allis clamp and used cautery to dissect it free from the  underlying tissue.  The entire specimen was excised and oriented with a long  suture lateral, short suture superior.  The biopsy cavity was then  thoroughly irrigated with saline.  The subcutaneous tissues were tacked down  to the anterior muscle fascia with interrupted 3-0  Vicryl sutures.  3-0  Vicryl was used to close the subcutaneous tissues and 4-0 Monocryl was used  to close the skin.   We then turned our attention to the patient's left side.  He was rotated  slightly to his right.  Local anesthetic was used and a transverse incision  was made below the nipple.  This mass was slightly smaller.  We raised skin  flaps with cautery.  The entire mass was then removed with cautery.  It was  again oriented with a long suture lateral and short suture superior.  The  wound was again irrigated and closed with tacking sutures of 3-0 Vicryl.  A  subcuticular layer  of 3-0 Vicryl was used to close skin as well as a subcuticular layer of 4-0  Monocryl.  Steri-Strips were applied to both incisions and 4x4 gauze was  taped over them.  The patient was extubated and brought to recovery in  stable condition.  All sponge, instrument, needle counts were correct.      Wilmon Arms. Tsuei, M.D.  Electronically Signed  MKT/MEDQ  D:  08/28/2006  T:  08/29/2006  Job:  [id number]

## 2011-05-17 NOTE — Consult Note (Signed)
[name] [name], [name] [name] NO.:  [id number]   MEDICAL RECORD NO.:  [id number]          PATIENT TYPE:  INP   LOCATION:  2928                         FACILITY:  [facility]   PHYSICIAN:  Wilmon Arms. Corliss Skains, M.D. DATE OF BIRTH:  February 18, 1941   DATE OF CONSULTATION:  10/21/2005  DATE OF DISCHARGE:                                   CONSULTATION   REASON FOR CONSULTATION:  Necrotic pancreatitis.   BRIEF HISTORY:  [name] [name] is a 70 year old white male who has a past medical  history significant for diabetes, coronary artery disease, hypertension and  hyperlipidemia.  The patient presented for admission on October 14, 2005,  with acute onset of severe nausea, vomiting, and epigastric pain.  This was  accompanied by watery diarrhea.  He presented to the emergency department  for evaluation and was found to have a markedly elevated lipase over 2000  with no abnormality in his liver function tests.  CT scan at that time  revealed peripancreatic edema and indeterminate right adrenal nodule.  The  patient was admitted to the hospital to the [facility] Team D service. He has  been managed with bowel rest, intravenous hydration and initially seemed to  improve, but on October 17, 2005, began having more abdominal pain.  He  developed ecchymosis of right flank.  He also began having more respiratory  difficulty and was moved to higher level of care.  Throughout his  hospitalization, a work-up has not revealed any etiology for his  pancreatitis.  His liver functions have been normal.  His gallbladder  appears normal.  His triglyceride level is also normal.  The patient  underwent a repeat CT scan on October 21, 2005, which showed no contrast  enhancement of the mid body over the pancreas consistent with some necrosis.  There is no discrete abscess or pseudocyst formation at this time.  We are  consulted today for possible surgical intervention.  The patient states that  he is doing a little bit  better today.  His respiratory status is still  marginal but he feels that he is breathing easier.  He continues to have  some abdominal tenderness but no peritoneal signs.   HOME MEDICATIONS:  1.  Altace.  2.  Avandia.  3.  Lipitor.  4.  Daily aspirin.   ALLERGIES:  NO KNOWN DRUG ALLERGIES.   PAST MEDICAL HISTORY:  1.  Hyperlipidemia.  2.  Type 2 diabetes.  3.  Coronary artery disease, mild.  4.  Hypertension.   PAST SURGICAL HISTORY:  None.   SOCIAL HISTORY:  Patient is married, quit smoking 10 years ago.  Drinks less  than a six-pack per month.   PHYSICAL EXAMINATION:  GENERAL APPEARANCE:  This is an obese male who is  awake, alert, in no apparent distress.  HEENT:  Extraocular movements intact. Sclerae are anicteric.  LUNGS:  Poor inspiratory effort.  Decreased breath sounds at both bases.  CARDIOVASCULAR:  Moderately tachycardic with pulses in the upper 90s.  ABDOMEN:  Soft, positive bowel sounds, minimal mid epigastric tenderness.   White count 12.2,  hemoglobin 12.1.  LFTs normal.   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   RECOMMENDATIONS:  Continue Primaxin.  Continue bowel rest with TNA as you  are doing.  Would repeat CT scan in two or three days to see if the fluid  collection is organized and might be amenable to percutaneous draining.  Surgery is not indicated at this time but we will continue to follow.      Wilmon Arms. Tsuei, M.D.  Electronically Signed     MKT/MEDQ  D:  10/21/2005  T:  10/22/2005  Job:  [id number]   cc:   Lonia Blood, M.D.

## 2011-05-17 NOTE — Assessment & Plan Note (Signed)
[facility]                             PRIMARY CARE OFFICE NOTE   WYLIE, COON                         MRN:          [id number]  DATE:07/23/2006                            DOB:          [birth year]    CHIEF COMPLAINT:  New patient to practice.   HISTORY OF PRESENT ILLNESS:  The patient is a 70 year old male with a  complex medical history here to establish primary care.  The patient was  previously followed by Dr. Susann Givens.  The patient was hospitalized in October  2006 secondary to severe necrotizing pancreatitis.  The patient's hospital  course was complicated by hypoxic respiratory failure.  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  The patient states that  pseudo-cyst was quite large and his pancreas was debrided on April 28, 2006.  Since his debridement, the patient has done fairly well.  Drains have been  removed and he has resumed a normal diet.  He has not followed up with his  surgeon in several months.  He also has a history of type 2 diabetes that is insulin-requiring.  He was  initially diagnosed in 2001.  He does have high cholesterol but it is fairly  well controlled.  During the time of his pancreatitis, there was no obvious  etiology.  The patient did not have severely elevated triglycerides and he  was never found to have any gallstones, and his pancreatitis was presumed  idiopathic.   MEDICATIONS:  1.  Metoclopramide 5 mg q.i.d. a.c.  2.  Toprol XL 50 mg once a day.  3.  BiDil 1 tab b.i.d.  4.  Lipitor 80 mg q.h.s.  5.  Altace 10 mg q.h.s.  6.  NPH insulin 28-30 unit b.i.d.  7.  Regular insulin, sliding scale, 10 units if blood sugar greater than      180.   ALLERGIES TO MEDICATIONS:  PERMAX WHICH IS REPORTED TO CAUSE RASH.   PAST MEDICAL HISTORY SUMMARY:  1.  History of necrotizing pancreatitis with pseudo-cyst, status  post      drainage.  2.  Type 2 diabetes.  3.  Hypertension.  4.  History of morbid obesity.  5.  History of coronary artery disease.  6.  History of perirectal abscess, drained, in December 2006.  7.  Cholecystectomy.  8.  History of arthroscopic left knee surgery in September 2003.   SOCIAL HISTORY:  The patient is married.  He is retired from the [employer] business.  He has three children, ages 21, 73, and 55.   FAMILY HISTORY:  Mother deceased at age 23 secondary to breast cancer.  Father deceased at age 87 secondary to coronary artery disease/MI.   HABITS:  He does not drink any alcohol.  Denies any tobacco use.  He quit  tobacco in November 1997.  He smoked for 35 years, approximately 3/4 pack  per day.   REVIEW OF SYSTEMS:  His main complaint during this visit has been bilateral  breast tenderness which has been there for several months.  He notes some  firmness behind his right nipple.  Denies any nipple discharge.  The patient  denies any chest pain.  No shortness of breath.  All other systems negative.   PHYSICAL EXAM:  VITALS:  Height 5 feet 8 inches.  Weight 226 pounds.  Temperature 97.8.  Pulse 59.  Blood pressure 169/71 in a seated position.  The patient notes that he did not take his blood pressure medication this  morning.  GENERAL:  The patient is a pleasant 70 year old white male in no apparent  distress.  HEENT:  Normocephalic, atraumatic.  Pupils are equal and react to light  bilaterally.  Extraocular mobility is intact.  The patient was anicteric.  Conjunctivae within normal limits.  External auditory canals and tympanic  membranes are clear bilaterally.  Oropharyngeal exam is unremarkable.  NECK:  Supple.  No carotid bruit or thyromegaly.  CHEST EXAM:  Normal respiratory effort.  LUNGS:  Clear to auscultation bilaterally.  No rhonchi, rales, or wheezing.  CARDIOVASCULAR:  Regular rate and rhythm.  No significant murmurs, rubs, or   gallops appreciated.  BREAST EXAM:  Reveals some firmness behind the right nipple. There was no  nipple discharge.  ABDOMEN:  Protuberant and there are well-healed scars, nontender.  Unable to  appreciate organomegaly.  EXTREMITY EXAM:  No clubbing, cyanosis, or edema.  The patient had  diminished but intact pedis dorsalis pulses.  NEUROLOGICALLY:  Cranial nerves II through XII grossly intact.  He was  nonfocal.   IMPRESSION/RECOMMENDATIONS:  1.  History of necrotizing pancreatitis with pseudo-cyst.  2.  Status post laparotomy for drainage of pancreatic cyst.  3.  Status post cholecystectomy.  4.  Type 2 diabetes, on insulin therapy, unknown status.  5.  Hypertension, suboptimal control.  6.  Dyslipidemia.  7.  Right breast tenderness.   RECOMMENDATIONS:  1.  The patient will have repeat labs including A1c, micro albumin-      creatinine ratio, and the patient is to return to our office with a log      of his blood sugars.  Also, the patient is to keep a log of his blood      pressures as well.  2.  In terms of his right breast tenderness, he will be sent for ultrasound      and also prolactin level as well as testosterone, estradiol, and beta-      HCG.  3.  Depending upon results of breast ultrasound, he actually may need      possible biopsy.  4.  He was encouraged to follow up with his surgeon at [facility].  The patient mentioned his surgeons may need to repeat a CAT      scan of his abdomen and pelvis.  5.  Followup time will be in approximately one month.                                   Barbette Hair. Artist Pais, DO   RDY/MedQ  DD:  07/24/2006  DT:  07/24/2006  Job #:  [id number]

## 2011-05-17 NOTE — Discharge Summary (Signed)
NAMEHART, HAAS NO.:  [id number]   MEDICAL RECORD NO.:  [id number]          PATIENT TYPE:  INP   LOCATION:  5739                         FACILITY:  [facility]   PHYSICIAN:  Adolph Pollack, M.D.DATE OF BIRTH:  1941/03/27   DATE OF ADMISSION:  11/29/2005  DATE OF DISCHARGE:  12/01/2005                                 DISCHARGE SUMMARY   PRIMARY DISCHARGE DIAGNOSIS:  Anorectal abscess.   SECONDARY DIAGNOSES:  1.  Mitral valve prolapse.  2.  Gastroesophageal reflux disease.  3.  Anxiety disorder.  4.  Diabetes mellitus.   PROCEDURE:  Complex incision and drainage and debridement of left anorectal  abscess.   HISTORY OF PRESENT ILLNESS:  This is a 70 year old who had an enlarging  anorectal mass that began spontaneously draining.  He was subsequently  admitted by Dr. Corliss Skains for incision and drainage.   HOSPITAL COURSE:  He underwent incision and drainage of the anorectal  abscess.  He was placed on sliding scale insulin.  He was placed on IV Zosyn  and doxycycline.  On the second postoperative day, he was feeling better,  and he is felt to be ready for discharge.   DISPOSITION:  Discharged to home on December 01, 2005.   DISCHARGE MEDICATIONS:  He was told to take Augmentin and doxycycline and  continue his home medications.   WOUND CARE:  He was given wound care instructions   FOLLOW UP:  He will follow up with me in the office in one week.      Adolph Pollack, M.D.  Electronically Signed     TJR/MEDQ  D:  03/12/2006  T:  03/13/2006  Job:  [id number]

## 2011-05-17 NOTE — H&P (Signed)
NAMELAVONTAE, CORNIA NO.:  [id number]   MEDICAL RECORD NO.:  [id number]          PATIENT TYPE:  EMS   LOCATION:  MAJO                         FACILITY:  [facility]   PHYSICIAN:  [name] Arms. Corliss Skains, M.D. DATE OF BIRTH:  07/19/1941   DATE OF ADMISSION:  11/29/2005  DATE OF DISCHARGE:                                HISTORY & PHYSICAL   CHIEF COMPLAINT:  Perirectal abscess.   HISTORY OF PRESENT ILLNESS:  The patient is a 70 year old diabetic male who  recently was hospitalized with severe necrotizing pancreatitis. He presents  with a 1-week history of a gradually enlarging right perirectal abscess.  This was first noticed on Thanksgiving morning. He was seen by his physician  on Monday and placed on p.o. antibiotics. This has continued to enlarge.  Yesterday it began spontaneously draining. The patient has been afebrile.  His last meal was at 11:30 a.m. this morning.   ALLERGIES:  PRIMAXIN.   MEDICATIONS:  Clonidine, Reglan, Toprol-XL, Lantus, Prilosec, BiDil .   PAST SURGICAL HISTORY:  Anal fissures and knee surgery.   PAST MEDICAL HISTORY:  1.  Diabetes.  2.  Hypertension.  3.  Obesity.  4.  Hyperlipidemia.  5.  Recent severe pancreatitis with pseudocyst.   PHYSICAL EXAMINATION:  VITAL SIGNS:  Height 5 feet 8 inches, weight 259,  blood pressure 156/66, pulse 95, temperature 97.8.  GENERAL:  This is an obese white male in no apparent distress.  HEENT:  EOMI, sclerae anicteric.  ABDOMEN:  Soft, nontender.  RECTAL:  Shows a very large right perirectal abscess which is spontaneously  draining. The erythema extends for approximately 15 cm up on his buttock and  down on his upper thigh. The area of fluctuance measures approximately 8-10  cm in diameter. There is no involvement of the left side.   IMPRESSION:  1.  Very large perirectal abscess.  2.  Diabetes.   PLAN:  Will admit for urgent incision and drainage, as well as IV  antibiotics and wound care. Will  consult [facility] hospitalist group for  medical management of his diabetes. This group took care of him for his  hospitalization for pancreatitis.      [name] [name], M.D.  Electronically Signed     MKT/MEDQ  D:  11/29/2005  T:  11/29/2005  Job:  [id number]
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
## 2011-05-17 NOTE — Discharge Summary (Signed)
[name] [name], [name] [name] NO.:  [id number]   MEDICAL RECORD NO.:  [id number]          PATIENT TYPE:  INP   LOCATION:  2928                         FACILITY:  [facility]   PHYSICIAN:  Nelma Rothman, MD   DATE OF BIRTH:  Jun 22, 1941   DATE OF ADMISSION:  10/14/2005  DATE OF DISCHARGE:                                 DISCHARGE SUMMARY   This is an interim Discharge Summary with final Discharge Summary to be  dictated by physician at time of discharge.   CURRENT DIAGNOSES:  1.  Severe necrotizing pancreatitis, idiopathic.  2.  Diabetes mellitus.  3.  Hypertension.  4.  Hyperlipidemia.  5.  Status post hypoxic respiratory failure secondary to severe abdominal      pain with decreased abdominal compliance and bilateral pleural effusions      which have since resolved.   Please see Discharge Summary dictated on October 27 by Dr. Lavera Guise for further  details regarding initial hospital course, but since that time procedures:  CT of the abdomen with contrast on October 25 demonstrated necrotic  pancreatitis with a developing pseudocyst along the greater curvature of the  stomach as well as small bilateral pleural effusions and a right adrenal  nodule.   HOSPITAL COURSE:  Problem 1.  With regard to his acute pancreatitis please  see Dr. Ellin Saba note for further details, but briefly he was admitted and  started on intravenous fluid rehydration and NPO. However, on hospital day  #3 he experienced increasing severity of his abdominal pain and difficulty  breathing. CT at that time showed extensive peripancreatic fluid and  necrosis at which time he was started on imipenem as well as TPN through a  PICC line. Since that time he has gradually improved and actually since I  first started seeing him on October 25, has not had any abdominal pain  whatsoever as well as no nausea or vomiting. There was some question  initially of whether or not the developing pseudocyst noted on October  25  should be drained or not. Interventional radiology at the time felt that it  was not mature enough to be amenable to percutaneous drainage. They  suggested a repeat CT a few days later to evaluate. However, over that time,  Mr. Bossi continued to improve. His white blood cell count continued to  decrease, and he remained afebrile. Therefore, clinically there is no  evidence that this fluid collection is infected at this point. There would  be no indication for drainage unless his clinical picture worsened. On  October 30 he was started on sips of clear liquids and actually tolerated  this quite well. This morning his diet was advanced to full liquids. Thus  far he appears to be tolerating this well. Please note that the etiology of  his pancreatitis is idiopathic at this point. He has no gallstones. He has  no history of heavy alcoholism or binges. His calcium is within normal  limits and triglycerides are not markedly elevated. There are reports of ACE  inhibitors and statins causing pancreatitis in rare circumstances,  both of  which he is taking. Once his oral medications are resumed it will need to be  readdressed whether to continue these or not. Given his history of diabetes,  overall, I think he would benefit greatly from these medications but we  clearly would not want to precipitate another episode such as this.   Problem 2.  Hypertension. Initially his hypertension was poorly controlled  since our therapeutic options were limited given his NPO status. He has been  on intravenous metoprolol and hydralazine. I started him on a clonidine  patch a few days ago. His blood pressures have been much better ever since  that time. Once he is reliably taking p.o. I anticipate he can be switched  back to his home regimen and intravenous medications as well as clonidine  patch can be discontinued.   Problem 3.  Diabetes mellitus. He does have a history of diabetes, but his  insulin  requirements are much greater than would be expected and are  probably secondary to his pancreatitis. He is currently on 65 units  subcutaneously b.i.d. of Lantus as well as 65 units/L in his TNA. At this  point he has maxed out the amount of insulin that can be used in his TNA,  and further increases will need to be made subcutaneously.   Problem 4.  Rash. On October 28, Mr. Yaffe nurse reported developing a rash  on his trunk. It was a maculopapular rash, most consistent with a drug  exanthem. This was felt most likely secondary to imipenem. Therefore, his  antibiotics were switched to Cipro and Flagyl, and he has done well on  these. The duration of his antibiotics at this point is unclear and will  need to be determined.           ______________________________  Nelma Rothman, MD     RAR/MEDQ  D:  10/29/2005  T:  10/30/2005  Job:  [id number]

## 2011-05-17 NOTE — H&P (Signed)
NAMEELON, EOFF NO.:  [id number]   MEDICAL RECORD NO.:  [id number]          PATIENT TYPE:  EMS   LOCATION:  [facility]                         FACILITY:  [facility]   PHYSICIAN:  Lonia Blood, M.D.DATE OF BIRTH:  Jan 05, 1941   DATE OF ADMISSION:  10/14/2005  DATE OF DISCHARGE:                                HISTORY & PHYSICAL   CHIEF COMPLAINT:  Abdominal pain with nausea and vomiting.   HISTORY OF PRESENT ILLNESS:  [name] [name] is a 70 year old gentleman with  medical history noted below.  He was in his usual state of health this  morning upon arising from bed.  He reports no recent difficulty concerning  his health.  He had a ham, egg, and cheese biscuit from his usual breakfast  establishment on his way to work.  At approximately 8 o'clock this morning,  he suffered the acute onset of severe nausea.  As this progressed through  the morning, he began to develop vomiting approximately 30 minutes later.  Shortly after that, he began to develop severe epigastric pain of a sharp,  stabbing nature which radiated across bilateral upper abdominal quadrants  into the flanks.  He also developed perfuse watery diarrhea with no blood.  The patient reports no prior history of similar symptoms.  As symptoms  progressed through the morning an became more severe, he went home.  There  he described his symptoms to his wife, and she wisely advised that they  present to the emergency room for evaluation.   In the emergency room, the patient has been given an antiemetic and pain  medication.  He remains alert at this time, however, and is able to provide  full history.  He reports that his abdominal pain has not ceased  significantly since his presentation.  He specifically denies more than a 6-  pack of beer per month.  He admits to 1 beer last night, but that is all.  His wife agrees that he only had one 12-ounce beer last night.  He has had  no symptoms of weight loss  lately.  He has not had any right upper quadrant  pain or intolerance to fatty foods lately.  He has not had any new  medications of late.   REVIEW OF SYSTEMS:  Review of Systems is positive for poorly controlled  diabetes over the last 3 months due to admitted dietary indiscretion.  Otherwise, the patient has been in his usual state of health, and full  Review of Systems is negative.   PAST MEDICAL HISTORY:  1.  Hyperlipidemia.  2.  Diabetes mellitus, type 2, diagnosed 5 years ago described as well      controlled with exception of recent dietary indiscretion.  3.  Cardiovascular evaluation via cardiac catheterization x2 [facility]      Cardiology).  The patient was informed that he had a small blockage      which did not require intervention.  4.  Hypertension.   OUTPATIENT MEDICATIONS:  1.  Altace, unknown dose, daily.  2.  Avandia 8 mg daily.  3.  Lipitor 80 mg daily.  4.  Aspirin 81 mg daily.   ALLERGIES:  No known drug allergies.   FAMILY HISTORY:  The patient's mother died due to congestive heart failure  but had a positive history for breast cancer.  The patient's father died of  an MI at 75.  The patient has multiple family members with early coronary  disease.  There is no history of pancreatic or biliary cancer in the family.   SOCIAL HISTORY:  The patient lives in [place].  He is married.  He has 3  children, one of whom has diabetes.  He works with a [employer]ologist, sport and exercise business.  He quit smoking approximately 10 years ago and never  smoked more than 1/2 pack per day.  He drinks less than a six-pack per  month.   DATA REVIEWED:  Electrolytes are balances.  BUN 17, creatinine 1.2, glucose  273.  White count is elevated at 26,000, hemoglobin normal at 15.6,  platelets normal. Lipase is markedly elevated at greater than 2000.  LFTs  are normal.  Total bilirubin is 0.8, albumin 3.9.  Urinalysis is positive  for trace leukocyte esterase, greater than 300  protein, 15 ketones, 100  glucose, 3 to 6 white cells, 3 to 6 red cells.   CT scan of the abdomen reveals acute pancreatitis and indeterminate right  adrenal nodule.   Chest x-ray reveals no acute disease.   A 12-lead EKG reveals sinus bradycardia at 57 beats per minute.   PHYSICAL EXAMINATION:  VITAL SIGNS:  Temperature 97.2, blood pressure  118/57, heart rate 66, respiratory rate 20, O2 saturation 96% on room air.  GENERAL:  Well-developed, well-nourished, mildly obese male in no acute  respiratory distress but in clear pain.  HEENT:  Normocephalic and atraumatic.  Pupils constricted bilaterally  secondary to narcotics but equal.  Oral cavity and oropharynx clear.  NECK:  No JVD, no lymphadenopathy, no thyromegaly.  LUNGS:  Clear to auscultation bilaterally without wheezes or rhonchi.  CARDIOVASCULAR: Regular rate and rhythm without murmur, gallop, or rub.  Normal S1 and S2.  ABDOMEN:  Obese, mildly distended, exquisitely tender throughout but worse  in the epigastrium. Bowel sounds are absent to exam.  There is no  appreciable mass. The abdomen is not acute. There is no significant rebound  tenderness.  EXTREMITIES: Trace bilateral lower extremity edema without significant  cutaneous wounds.  NEUROLOGIC: Cranial nerves II-XII intact bilaterally, 5/5 strength  throughout bilateral upper and lower extremities.  Alert and oriented x4.   IMPRESSION AND PLAN:  1.  Acute pancreatitis: [name] [name] is clearly suffering with a severe episode      of acute pancreatitis.  The exact etiology of this is not clear.  The      patient reports that his alcohol intake is minimal, and I am inclined to      believe him.  His wife supports this as well. Other culprits could      include Lipitor versus viral versus a passed gallstone.  We will admit      the patient to the acute unit for pain management. We will administer IV     fluids to prevent dehydration.  We will recheck LFTs in the morning to       assure there is no bump to suggest questionable choledocholithiasis.  If      LFTs are elevated, we will consider an MRCP for further evaluation.      Electrolytes will be followed  closely to include calcium, magnesium,      phosphorous, potassium, sodium.  Proton pump inhibitor could assist with      symptom management and will be administered.  IV fluids will be provided      for support.  2.  Diabetes mellitus: The patient admits to poor control of his diabetes      recently which he attributes to dietary discretion.  The patient will be      n.p.o.  He will not be dosed with standing insulin but will be given      sliding scale insulin on a regular basis.  If necessary, Lantus will be      added to his regimen.  We will continue aspirin.  3.  Hyperlipidemia:  It is quite possible the hypertriglyceridemia could be      a cause of the patient's pancreatitis.  We will check a fasting lipid      panel to assess his triglyceride level.  4.  Indeterminate right renal nodule: Should an MRI be required to further      evaluate the patient's biliary tree, this      should provide adequate view of the patient's adrenal gland as well.  We      will follow this issue closely during this hospital stay.  If MRI      imaging is not required for his pancreas, we will consider further      evaluation of this adrenal nodule.      Lonia Blood, M.D.  Electronically Signed     JTM/MEDQ  D:  10/14/2005  T:  10/14/2005  Job:  [id number]

## 2011-06-13 ENCOUNTER — Ambulatory Visit (INDEPENDENT_AMBULATORY_CARE_PROVIDER_SITE_OTHER): Payer: Medicare Other | Admitting: Family Medicine

## 2011-06-13 ENCOUNTER — Ambulatory Visit: Payer: Self-pay | Admitting: Internal Medicine

## 2011-06-13 ENCOUNTER — Encounter: Payer: Self-pay | Admitting: Family Medicine

## 2011-06-13 VITALS — BP 126/56 | HR 60 | Temp 97.9°F | Resp 18 | Wt 298.0 lb

## 2011-06-13 MED ORDER — METFORMIN HCL ER 500 MG PO TB24: 500.0000 mg | ORAL_TABLET | Freq: Two times a day (BID) | ORAL | Status: DC

## 2011-06-13 NOTE — Assessment & Plan Note (Signed)
Glucoses improving slightly, but I encouraged pt to continue titration of insulins to get goal fasting glucose 100-110 range and goal 2H postprandial glucoses into 140-160 range consistently.   Also, will start him on ASA 81mg  qd.

## 2011-06-13 NOTE — Progress Notes (Signed)
OFFICE NOTE  06/13/2011  CC:  Chief Complaint  Patient presents with  . Diabetes    Pt here for 4 week f/u. Has seen some improvement with morning BS.     HPI:   Patient is a 70 y.o. Caucasian male who is here for 1 mo f/u DM 2.   Fasting gluc still >170, which is slightly improved per pt---since increasing lantus to 60 U qhs and 30 U qAm Two hour PP gluc 180s or so per pt.--usually gives 50 U w/morning and w/evening meal. Only one hypoglyc episode---took mealtime insulin and forgot to eat--felt weak and shaky and then at snack quickly.  Pertinent PMH:  DM 2, poor control--last Hb A1c 10.1% on 05/16/11, which was up 1% from prev check. HTN Hypothyroidism Obesity OSA CAD  MEDS;   Outpatient Prescriptions Prior to Visit  Medication Sig Dispense Refill  . amLODipine (NORVASC) 10 MG tablet Take 10 mg by mouth daily.        . cloNIDine (CATAPRES) 0.1 MG tablet Take 0.1 mg by mouth at bedtime.        . colesevelam (WELCHOL) 625 MG tablet Take 2 tablets (1,250 mg total) by mouth 2 (two) times daily with a meal. 2 tablets by mouth 2 times daily  120 tablet  1  . diclofenac sodium (VOLTAREN) 1 % GEL Apply 2 g topically 3 (three) times daily.        . INS SYRINGE/NEEDLE .5CC/29G (B-D INSULIN SYRINGE) 29G X 1/2" 0.5 ML MISC by Does not apply route. Use for insulin injection once daily       . insulin glargine (LANTUS) 100 UNIT/ML injection 60 units at bedtime 30 units in AM  10 mL    . insulin glulisine (APIDRA) 100 UNIT/ML injection Inject 40-50 Units into the skin 2 (two) times daily before a meal.        . isosorbide-hydrALAZINE (BIDIL) 20-37.5 MG per tablet Take 1 tablet by mouth 2 (two) times daily.        Marland Kitchen levothyroxine (LEVOTHROID) 25 MCG tablet Take 25 mcg by mouth daily.        . metoprolol (TOPROL XL) 50 MG 24 hr tablet Take 50 mg by mouth daily.        . Precision Thin Lancets MISC by Does not apply route. Use to check blood sugar three times daily       . ramipril (ALTACE)  10 MG tablet Take 10 mg by mouth 2 (two) times daily.        . simvastatin (ZOCOR) 20 MG tablet Take 20 mg by mouth at bedtime.        . Tamsulosin HCl (FLOMAX) 0.4 MG CAPS Take 0.4 mg by mouth daily.        Marland Kitchen torsemide (DEMADEX) 20 MG tablet Take 1 tablet (20 mg total) by mouth daily.  90 tablet  3  . metFORMIN (GLUCOPHAGE-XR) 500 MG 24 hr tablet Take 500 mg by mouth 2 (two) times daily.          PE: Blood pressure 126/56, pulse 60, temperature 97.9 F (36.6 C), temperature source Oral, resp. rate 18, weight 298 lb (135.172 kg), SpO2 94.00%. Gen: Alert, well appearing.  Patient is oriented to person, place, time, and situation. Chest: symmetric expansion, nonlabored respirations.  Clear and equal breath sounds in all lung fields.   CV: RRR, no m/r/g.  Peripheral pulses 2+ and symmetric. EXT: bilat 2+ pitting edema from knees to feet, with chronic venous stasis  freckling.  IMPRESSION AND PLAN:  DIABETES MELLITUS, TYPE II, UNCONTROLLED Glucoses improving slightly, but I encouraged pt to continue titration of insulins to get goal fasting glucose 100-110 range and goal 2H postprandial glucoses into 140-160 range consistently.   Also, will start him on ASA 81mg  qd.   RF'd metformin today.  FOLLOW UP:  Return in about 2 months (around 08/13/2011).  Will repeat HbA1c the week prior to his f/u visit.

## 2011-06-15 ENCOUNTER — Other Ambulatory Visit: Payer: Self-pay | Admitting: Internal Medicine

## 2011-06-17 ENCOUNTER — Telehealth: Payer: Self-pay | Admitting: Internal Medicine

## 2011-06-17 NOTE — Telephone Encounter (Signed)
Refill apidra 2 vials [facility] on [address]

## 2011-06-17 NOTE — Telephone Encounter (Signed)
Rx refill sent to pharmacy. 

## 2011-06-17 NOTE — Telephone Encounter (Signed)
Patient called back stating that he usually gets 3 vials of apidra, not 2.

## 2011-06-18 MED ORDER — INSULIN GLULISINE 100 UNIT/ML IJ SOLN: 40.0000 [IU] | Freq: Two times a day (BID) | INTRAMUSCULAR | Status: DC

## 2011-06-18 NOTE — Telephone Encounter (Signed)
Rx resubmitted to pharmacy °

## 2011-06-25 ENCOUNTER — Encounter: Payer: Self-pay | Admitting: Internal Medicine

## 2011-07-19 ENCOUNTER — Other Ambulatory Visit: Payer: Self-pay | Admitting: Internal Medicine

## 2011-08-13 ENCOUNTER — Other Ambulatory Visit: Payer: PRIVATE HEALTH INSURANCE

## 2011-08-20 ENCOUNTER — Ambulatory Visit: Payer: PRIVATE HEALTH INSURANCE | Admitting: Internal Medicine

## 2011-09-06 ENCOUNTER — Other Ambulatory Visit: Payer: Self-pay | Admitting: Internal Medicine

## 2011-09-09 ENCOUNTER — Ambulatory Visit: Payer: Self-pay | Admitting: Pulmonary Disease

## 2011-09-19 LAB — BASIC METABOLIC PANEL
BUN: 24 — ABNORMAL HIGH
BUN: 30 — ABNORMAL HIGH
BUN: 52 — ABNORMAL HIGH
CO2: 23
CO2: 25
CO2: 32
CO2: 34 — ABNORMAL HIGH
Calcium: 7.8 — ABNORMAL LOW
Calcium: 8.2 — ABNORMAL LOW
Calcium: 8.3 — ABNORMAL LOW
Chloride: 100
Chloride: 102
Chloride: 106
Chloride: 107
Chloride: 107
Creatinine, Ser: 0.73
Creatinine, Ser: 0.83
Creatinine, Ser: 0.93
Creatinine, Ser: 1.7 — ABNORMAL HIGH
GFR calc Af Amer: 39 — ABNORMAL LOW
GFR calc Af Amer: 49 — ABNORMAL LOW
GFR calc Af Amer: >60
GFR calc Af Amer: >60
GFR calc non Af Amer: 32 — ABNORMAL LOW
GFR calc non Af Amer: 44 — ABNORMAL LOW
GFR calc non Af Amer: >60
GFR calc non Af Amer: >60
Glucose, Bld: 143 — ABNORMAL HIGH
Glucose, Bld: 182 — ABNORMAL HIGH
Glucose, Bld: 225 — ABNORMAL HIGH
Glucose, Bld: 259 — ABNORMAL HIGH
Glucose, Bld: 288 — ABNORMAL HIGH
Potassium: 4.2
Potassium: 4.2
Potassium: 4.3
Potassium: 4.5
Potassium: 5.3 — ABNORMAL HIGH
Potassium: 5.7 — ABNORMAL HIGH
Sodium: 133 — ABNORMAL LOW
Sodium: 136
Sodium: 136
Sodium: 138

## 2011-09-19 LAB — CROSSMATCH
ABO/RH(D): O POS
Antibody Screen: NEGATIVE

## 2011-09-19 LAB — CBC
HCT: 22.9 — ABNORMAL LOW
HCT: 23.3 — ABNORMAL LOW
HCT: 24 — ABNORMAL LOW
HCT: 24.6 — ABNORMAL LOW
HCT: 28.2 — ABNORMAL LOW
HCT: 31.3 — ABNORMAL LOW
HCT: 34.9 — ABNORMAL LOW
HCT: 41.2
Hemoglobin: 10.6 — ABNORMAL LOW
Hemoglobin: 13.7
Hemoglobin: 8.2 — ABNORMAL LOW
Hemoglobin: 8.2 — ABNORMAL LOW
Hemoglobin: 9.6 — ABNORMAL LOW
MCHC: 33.2
MCHC: 33.5
MCHC: 33.8
MCHC: 33.8
MCHC: 33.9
MCHC: 34.4
MCV: 84.9
MCV: 85.4
MCV: 85.4
MCV: 85.7
MCV: 85.8
MCV: 87.4
MCV: 87.7
MCV: 88.7
Platelets: 108 — ABNORMAL LOW
Platelets: 114 — ABNORMAL LOW
Platelets: 147 — ABNORMAL LOW
Platelets: 166
Platelets: 238
RBC: 3.28 — ABNORMAL LOW
RBC: 3.4 — ABNORMAL LOW
RBC: 3.45 — ABNORMAL LOW
RBC: 3.57 — ABNORMAL LOW
RBC: 4.09 — ABNORMAL LOW
RBC: 4.84
RDW: 13.9
RDW: 14.3
RDW: 14.4
RDW: 14.4
RDW: 15.5
WBC: 14.3 — ABNORMAL HIGH
WBC: 9.1
WBC: 9.7

## 2011-09-19 LAB — I-STAT 8, (EC8 V) (CONVERTED LAB)
Acid-base deficit: 3 — ABNORMAL HIGH
BUN: 21
Bicarbonate: 23
Chloride: 108
Glucose, Bld: 311 — ABNORMAL HIGH
HCT: 46
Potassium: 5.4 — ABNORMAL HIGH
Sodium: 136
pCO2, Ven: 41.9 — ABNORMAL LOW

## 2011-09-19 LAB — ABO/RH: ABO/RH(D): O POS

## 2011-09-19 LAB — COMPREHENSIVE METABOLIC PANEL
AST: 43 — ABNORMAL HIGH
BUN: 50 — ABNORMAL HIGH
CO2: 25
Calcium: 7.8 — ABNORMAL LOW
Chloride: 101
Creatinine, Ser: 1.49
GFR calc Af Amer: 57 — ABNORMAL LOW
GFR calc non Af Amer: 47 — ABNORMAL LOW
Total Bilirubin: 0.7

## 2011-09-19 LAB — DIFFERENTIAL
Eosinophils Relative: 1
Lymphocytes Relative: 20
Monocytes Absolute: 0.8
Monocytes Relative: 6
Neutro Abs: 10.7 — ABNORMAL HIGH

## 2011-09-19 LAB — PROTIME-INR
INR: 1.2
INR: 1.2

## 2011-09-19 LAB — APTT: aPTT: 26

## 2011-09-19 LAB — POCT I-STAT CREATININE
Creatinine, Ser: 1.2
Operator id: 270651

## 2011-09-19 LAB — BLOOD GAS, ARTERIAL
Acid-Base Excess: 7 — ABNORMAL HIGH
Acid-base deficit: 1.3
Bicarbonate: 24
Drawn by: 273391
PEEP: 5
TCO2: 25.5
pCO2 arterial: 48.2 — ABNORMAL HIGH
pCO2 arterial: 48.6 — ABNORMAL HIGH
pH, Arterial: 7.43
pO2, Arterial: 63.3 — ABNORMAL LOW

## 2011-09-19 LAB — HEMOGLOBIN A1C: Hgb A1c MFr Bld: 8.8 — ABNORMAL HIGH

## 2011-09-20 LAB — CBC
HCT: 33.2 — ABNORMAL LOW
Hemoglobin: 11.2 — ABNORMAL LOW
Hemoglobin: 11.8 — ABNORMAL LOW
MCHC: 33.8
MCHC: 32.6
MCV: 86.5
Platelets: 264
Platelets: 307
Platelets: 218
RDW: 15.4
RDW: 15.8 — ABNORMAL HIGH
RDW: 15.7 — ABNORMAL HIGH
WBC: 11.4 — ABNORMAL HIGH

## 2011-09-20 LAB — COMPREHENSIVE METABOLIC PANEL
AST: 42 — ABNORMAL HIGH
Albumin: 2.3 — ABNORMAL LOW
Alkaline Phosphatase: 118 — ABNORMAL HIGH
Chloride: 102
Creatinine, Ser: 0.73
GFR calc Af Amer: >60
Potassium: 4.1
Total Bilirubin: 0.9
Total Protein: 5.5 — ABNORMAL LOW

## 2011-09-20 LAB — I-STAT 8, (EC8 V) (CONVERTED LAB)
BUN: 12
Chloride: 105
Glucose, Bld: 164 — ABNORMAL HIGH
HCT: 40
Hemoglobin: 13.6
Operator id: 265201
Sodium: 137
pCO2, Ven: 37 — ABNORMAL LOW

## 2011-09-20 LAB — DIFFERENTIAL
Basophils Absolute: 0.1
Basophils Absolute: 0
Basophils Relative: 0
Eosinophils Relative: 3
Lymphocytes Relative: 12
Lymphocytes Relative: 16
Monocytes Absolute: 1.1 — ABNORMAL HIGH
Monocytes Absolute: 0.6
Monocytes Relative: 10
Neutro Abs: 5.3
Neutrophils Relative %: 72

## 2011-09-20 LAB — URINALYSIS, ROUTINE W REFLEX MICROSCOPIC
Glucose, UA: NEGATIVE
Ketones, ur: NEGATIVE
Nitrite: NEGATIVE
Protein, ur: NEGATIVE
Urobilinogen, UA: 1

## 2011-09-20 LAB — URINE CULTURE

## 2011-09-20 LAB — URINE MICROSCOPIC-ADD ON

## 2011-09-20 LAB — POCT CARDIAC MARKERS
CKMB, poc: <1 — ABNORMAL LOW
Operator id: 265201
Troponin i, poc: <0.05

## 2011-09-26 ENCOUNTER — Other Ambulatory Visit: Payer: Self-pay | Admitting: *Deleted

## 2011-09-26 ENCOUNTER — Telehealth: Payer: Self-pay | Admitting: Internal Medicine

## 2011-09-26 MED ORDER — GLUCOSE BLOOD VI STRP: ORAL_STRIP | Status: DC

## 2011-09-26 NOTE — Telephone Encounter (Signed)
Refill- truetest glucose test strips. Use to check blood sugar 2 times a day. Qty 100. Last fill 8.24.12

## 2011-09-26 NOTE — Telephone Encounter (Signed)
Done

## 2011-09-30 LAB — GLUCOSE, CAPILLARY
Glucose-Capillary: 222 — ABNORMAL HIGH
Glucose-Capillary: 240 — ABNORMAL HIGH

## 2011-10-04 ENCOUNTER — Telehealth: Payer: Self-pay | Admitting: Internal Medicine

## 2011-10-04 MED ORDER — GLUCOSE BLOOD VI STRP: ORAL_STRIP | Status: DC

## 2011-10-04 NOTE — Telephone Encounter (Signed)
rx sent in electronically 

## 2011-10-04 NOTE — Telephone Encounter (Signed)
Pt had a refill for glucose blood (TRUETEST TEST) test strip called in last week to [facility] from the [place] location. Pt is requesting for the prescription to be resubmitted to [address].

## 2011-10-16 ENCOUNTER — Ambulatory Visit (INDEPENDENT_AMBULATORY_CARE_PROVIDER_SITE_OTHER): Payer: Medicare Other | Admitting: Internal Medicine

## 2011-10-16 ENCOUNTER — Telehealth: Payer: Self-pay | Admitting: Internal Medicine

## 2011-10-16 ENCOUNTER — Encounter: Payer: Self-pay | Admitting: Internal Medicine

## 2011-10-16 VITALS — BP 122/60 | HR 92 | Temp 100.2°F | Resp 20 | Wt 305.0 lb

## 2011-10-16 DIAGNOSIS — J069 Acute upper respiratory infection, unspecified: Secondary | ICD-10-CM

## 2011-10-16 DIAGNOSIS — E785 Hyperlipidemia, unspecified: Secondary | ICD-10-CM

## 2011-10-16 MED ORDER — DOXYCYCLINE HYCLATE 100 MG PO TABS: 100.0000 mg | ORAL_TABLET | Freq: Two times a day (BID) | ORAL | Status: AC

## 2011-10-16 NOTE — Patient Instructions (Signed)
Please schedule cbc, chem7, a1c, urine microalbumin 250.0 and lipid/lft 272.4 prior to next visit 

## 2011-10-16 NOTE — Telephone Encounter (Signed)
Lab orders enter for November at [facility].

## 2011-10-17 DIAGNOSIS — J069 Acute upper respiratory infection, unspecified: Secondary | ICD-10-CM | POA: Insufficient documentation

## 2011-10-17 LAB — DIFFERENTIAL
Eosinophils Absolute: 0.2
Eosinophils Relative: 2
Lymphs Abs: 1.5
Monocytes Absolute: 1.1 — ABNORMAL HIGH
Monocytes Relative: 11

## 2011-10-17 LAB — LIPASE, BLOOD: Lipase: 35

## 2011-10-17 LAB — CBC
HCT: 38.3 — ABNORMAL LOW
MCV: 83.6
RBC: 4.58
WBC: 9.9

## 2011-10-17 LAB — HEPATIC FUNCTION PANEL
Alkaline Phosphatase: 49
Indirect Bilirubin: 0.8
Total Bilirubin: 1

## 2011-10-17 LAB — I-STAT 8, (EC8 V) (CONVERTED LAB)
Acid-base deficit: 1
TCO2: 22
pCO2, Ven: 26.3 — ABNORMAL LOW
pH, Ven: 7.51 — ABNORMAL HIGH

## 2011-10-17 LAB — POCT I-STAT CREATININE: Operator id: 257131

## 2011-10-17 LAB — URINALYSIS, ROUTINE W REFLEX MICROSCOPIC
Bilirubin Urine: NEGATIVE
Ketones, ur: NEGATIVE
Nitrite: NEGATIVE
Urobilinogen, UA: 0.2

## 2011-10-17 LAB — CULTURE, BLOOD (ROUTINE X 2)
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
## 2011-10-17 NOTE — Assessment & Plan Note (Signed)
Begin po abx. Followup if no improvement or worsening.

## 2011-10-24 ENCOUNTER — Other Ambulatory Visit: Payer: Self-pay | Admitting: Internal Medicine

## 2011-10-24 NOTE — Telephone Encounter (Signed)
Rx refill sent to pharmacy. 

## 2011-10-30 ENCOUNTER — Telehealth: Payer: Self-pay | Admitting: Internal Medicine

## 2011-10-30 MED ORDER — "INSULIN SYRINGE/NEEDLE 28G X 1/2"" 1 ML MISC": Status: DC

## 2011-10-30 NOTE — Telephone Encounter (Signed)
Needs needles for his insulin [facility] to [facility] [address]

## 2011-10-30 NOTE — Telephone Encounter (Signed)
Call placed to patients wife at [phone], she was asked if patient had insulin vial or pens. She stated he will need refills on the syringes for vials.  Rx for insulin syringes sent to pharmacy.

## 2011-11-08 ENCOUNTER — Other Ambulatory Visit (INDEPENDENT_AMBULATORY_CARE_PROVIDER_SITE_OTHER): Payer: Medicare Other

## 2011-11-08 DIAGNOSIS — IMO0001 Reserved for inherently not codable concepts without codable children: Secondary | ICD-10-CM

## 2011-11-08 DIAGNOSIS — E785 Hyperlipidemia, unspecified: Secondary | ICD-10-CM

## 2011-11-08 LAB — CBC WITH DIFFERENTIAL/PLATELET
Basophils Relative: 0.8 % (ref 0.0–3.0)
Eosinophils Absolute: 0.4 10*3/uL (ref 0.0–0.7)
Eosinophils Relative: 7.1 % — ABNORMAL HIGH (ref 0.0–5.0)
Hemoglobin: 14.1 g/dL (ref 13.0–17.0)
Lymphocytes Relative: 37.5 % (ref 12.0–46.0)
MCHC: 33.9 g/dL (ref 30.0–36.0)
MCV: 87.2 fl (ref 78.0–100.0)
Neutro Abs: 2.3 10*3/uL (ref 1.4–7.7)
RBC: 4.77 Mil/uL (ref 4.22–5.81)
WBC: 5.3 10*3/uL (ref 4.5–10.5)

## 2011-11-08 LAB — MICROALBUMIN / CREATININE URINE RATIO: Microalb Creat Ratio: 1.1 mg/g (ref 0.0–30.0)

## 2011-11-08 LAB — HEPATIC FUNCTION PANEL
ALT: 31 U/L (ref 0–53)
Albumin: 3.6 g/dL (ref 3.5–5.2)
Total Protein: 6.9 g/dL (ref 6.0–8.3)

## 2011-11-08 LAB — LIPID PANEL
Cholesterol: 142 mg/dL (ref 0–200)
HDL: 31.6 mg/dL — ABNORMAL LOW (ref >39.00)
LDL Cholesterol: 79 mg/dL (ref 0–99)
Triglycerides: 156 mg/dL — ABNORMAL HIGH (ref 0.0–149.0)
VLDL: 31.2 mg/dL (ref 0.0–40.0)

## 2011-11-08 LAB — BASIC METABOLIC PANEL
BUN: 17 mg/dL (ref 6–23)
Chloride: 103 mEq/L (ref 96–112)
Creatinine, Ser: 0.9 mg/dL (ref 0.4–1.5)
Glucose, Bld: 162 mg/dL — ABNORMAL HIGH (ref 70–99)

## 2011-11-08 LAB — HEMOGLOBIN A1C: Hgb A1c MFr Bld: 9.7 % — ABNORMAL HIGH (ref 4.6–6.5)

## 2011-11-14 ENCOUNTER — Telehealth: Payer: Self-pay | Admitting: Internal Medicine

## 2011-11-14 ENCOUNTER — Ambulatory Visit (INDEPENDENT_AMBULATORY_CARE_PROVIDER_SITE_OTHER): Payer: Medicare Other | Admitting: Internal Medicine

## 2011-11-14 ENCOUNTER — Encounter: Payer: Self-pay | Admitting: Internal Medicine

## 2011-11-14 VITALS — BP 124/70 | HR 55 | Temp 98.0°F | Resp 18 | Ht 68.0 in | Wt 312.0 lb

## 2011-11-14 DIAGNOSIS — E039 Hypothyroidism, unspecified: Secondary | ICD-10-CM

## 2011-11-14 DIAGNOSIS — M6530 Trigger finger, unspecified finger: Secondary | ICD-10-CM | POA: Insufficient documentation

## 2011-11-14 DIAGNOSIS — E119 Type 2 diabetes mellitus without complications: Secondary | ICD-10-CM
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
## 2011-11-14 NOTE — Patient Instructions (Signed)
Please take your apidra 15 minutes before each meal. Increase your lantus 3 units every 3 days until your morning fasting sugars are less than 130. Please call a blood sugar report to the clinic in 3 weeks Schedule tsh (hypothyroidism), chem7, a1c 250.0 prior to next visit

## 2011-11-14 NOTE — Assessment & Plan Note (Signed)
Continue voltaren gel. Orthopedic referral is sx's do not improve

## 2011-11-14 NOTE — Assessment & Plan Note (Signed)
suboptimal control. Increase lantus 3 units every 3 days until fasting am sugars less than 130. Take apidra before each meal. Resume regular exercise. Call fsbs report in 3 wks.

## 2011-11-14 NOTE — Telephone Encounter (Signed)
Lab orders entered

## 2012-01-01 ENCOUNTER — Telehealth: Payer: Self-pay | Admitting: Internal Medicine

## 2012-01-01 MED ORDER — RAMIPRIL 10 MG PO CAPS: 10.0000 mg | ORAL_CAPSULE | Freq: Two times a day (BID) | ORAL | Status: DC

## 2012-01-01 NOTE — Telephone Encounter (Signed)
Verified with pt that he still wants to use [facility]. Refill sent via e-Rx #180 x 1 refill.

## 2012-01-01 NOTE — Telephone Encounter (Signed)
Refill- ramipril cap 10mg . Take one capsule twice daily. Qty 90.

## 2012-01-03 ENCOUNTER — Telehealth: Payer: Self-pay | Admitting: Internal Medicine

## 2012-01-03 MED ORDER — CLONIDINE HCL 0.1 MG PO TABS: 0.1000 mg | ORAL_TABLET | Freq: Every day | ORAL | Status: DC

## 2012-01-03 MED ORDER — INSULIN GLARGINE 100 UNIT/ML ~~LOC~~ SOLN: SUBCUTANEOUS | Status: DC

## 2012-01-03 MED ORDER — TORSEMIDE 20 MG PO TABS: 20.0000 mg | ORAL_TABLET | Freq: Every day | ORAL | Status: DC

## 2012-01-03 NOTE — Telephone Encounter (Signed)
Torsemide 20mg  tab. Take 1 to 2 tablets one time daily as directed. Qty 180  Clonidine 0.1mg  tab. Take one tablet daily at bedtime. Qty 90.

## 2012-01-03 NOTE — Telephone Encounter (Signed)
Rx refills sent to pharmacies.

## 2012-01-03 NOTE — Telephone Encounter (Signed)
Patient needs Rx for his lantus called in to [facility], [address] ,he has been getting this med from [place],

## 2012-01-07 ENCOUNTER — Other Ambulatory Visit: Payer: Self-pay | Admitting: *Deleted

## 2012-01-07 MED ORDER — TORSEMIDE 20 MG PO TABS: 20.0000 mg | ORAL_TABLET | Freq: Every day | ORAL | Status: DC

## 2012-01-07 NOTE — Telephone Encounter (Signed)
Faxed refill request for torsemide received from [facility]. Rx refill sent to pharmacy.

## 2012-01-30 ENCOUNTER — Telehealth: Payer: Self-pay | Admitting: Internal Medicine

## 2012-01-30 MED ORDER — INSULIN GLARGINE 100 UNIT/ML ~~LOC~~ SOLN: SUBCUTANEOUS | Status: DC

## 2012-01-30 NOTE — Telephone Encounter (Signed)
Ok to fill at requested #

## 2012-01-30 NOTE — Telephone Encounter (Signed)
Call returned to patient, he was advised per Dr Rodena Medin instruction.

## 2012-01-30 NOTE — Telephone Encounter (Signed)
Dr Rodena Medin prescribed lantus 30 units which is 3 boxes .  It is 4 nire days before he can order again.  He has about 1 or 2 shots left.  He will need 4 vials of lantus in order to make 1 month.

## 2012-01-30 NOTE — Telephone Encounter (Signed)
Spoke to pt. He states he has been taking 30 units every morning and 60 at night. He states he sometimes takes more depending on his blood sugar readings. May take 50 to 55 units in the morning 2-3 times a week.  States he may have enough until Monday but is running a little short of 30 days. Pt is requesting refill for 4 vials at a time. Please advise.

## 2012-02-07 ENCOUNTER — Other Ambulatory Visit (INDEPENDENT_AMBULATORY_CARE_PROVIDER_SITE_OTHER): Payer: Medicare Other

## 2012-02-07 DIAGNOSIS — E039 Hypothyroidism, unspecified: Secondary | ICD-10-CM | POA: Diagnosis not present

## 2012-02-07 LAB — BASIC METABOLIC PANEL
Calcium: 9.1 mg/dL (ref 8.4–10.5)
GFR: 86.46 mL/min (ref >60.00)
Potassium: 4.4 mEq/L (ref 3.5–5.1)
Sodium: 138 mEq/L (ref 135–145)

## 2012-02-07 LAB — HEMOGLOBIN A1C: Hgb A1c MFr Bld: 9.9 % — ABNORMAL HIGH (ref 4.6–6.5)

## 2012-02-07 LAB — TSH: TSH: 2.4 u[IU]/mL (ref 0.35–5.50)

## 2012-02-14 ENCOUNTER — Encounter: Payer: Self-pay | Admitting: Internal Medicine

## 2012-02-14 ENCOUNTER — Ambulatory Visit (INDEPENDENT_AMBULATORY_CARE_PROVIDER_SITE_OTHER): Payer: Medicare Other | Admitting: Internal Medicine

## 2012-02-14 VITALS — BP 118/64 | HR 63 | Temp 98.2°F | Resp 18 | Ht 68.0 in | Wt 315.0 lb

## 2012-02-14 DIAGNOSIS — E119 Type 2 diabetes mellitus without complications: Secondary | ICD-10-CM | POA: Diagnosis not present

## 2012-02-14 DIAGNOSIS — E785 Hyperlipidemia, unspecified: Secondary | ICD-10-CM | POA: Diagnosis not present

## 2012-02-14 DIAGNOSIS — I10 Essential (primary) hypertension: Secondary | ICD-10-CM
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
## 2012-02-14 NOTE — Patient Instructions (Signed)
Please schedule lipid/lft 272.4 prior to next visit  

## 2012-02-14 NOTE — Assessment & Plan Note (Signed)
Persistently poor control. Proceed with endocrinology referral.

## 2012-02-14 NOTE — Assessment & Plan Note (Signed)
Normotensive and stable. Continue current regimen. Monitor bp as outpt and followup in clinic as scheduled.  

## 2012-02-14 NOTE — Assessment & Plan Note (Signed)
Obtain lipid/lft prior to next visit 

## 2012-02-19 ENCOUNTER — Other Ambulatory Visit: Payer: Self-pay | Admitting: Internal Medicine

## 2012-02-21 DIAGNOSIS — E789 Disorder of lipoprotein metabolism, unspecified: Secondary | ICD-10-CM | POA: Diagnosis not present

## 2012-02-21 DIAGNOSIS — E119 Type 2 diabetes mellitus without complications: Secondary | ICD-10-CM | POA: Diagnosis not present

## 2012-02-21 DIAGNOSIS — I10 Essential (primary) hypertension: Secondary | ICD-10-CM | POA: Diagnosis not present

## 2012-02-25 DIAGNOSIS — E119 Type 2 diabetes mellitus without complications: Secondary | ICD-10-CM | POA: Diagnosis not present

## 2012-02-25 DIAGNOSIS — E0789 Other specified disorders of thyroid: Secondary | ICD-10-CM | POA: Diagnosis not present

## 2012-02-25 DIAGNOSIS — E789 Disorder of lipoprotein metabolism, unspecified: Secondary | ICD-10-CM | POA: Diagnosis not present

## 2012-03-03 ENCOUNTER — Telehealth: Payer: Self-pay | Admitting: Internal Medicine

## 2012-03-03 MED ORDER — METFORMIN HCL ER 500 MG PO TB24: 500.0000 mg | ORAL_TABLET | Freq: Two times a day (BID) | ORAL | Status: DC

## 2012-03-03 NOTE — Telephone Encounter (Signed)
Metformin refill #60 x 2 refills sent to pharmacy.

## 2012-03-04 ENCOUNTER — Ambulatory Visit (INDEPENDENT_AMBULATORY_CARE_PROVIDER_SITE_OTHER): Payer: Medicare Other | Admitting: Internal Medicine

## 2012-03-04 ENCOUNTER — Encounter: Payer: Self-pay | Admitting: Internal Medicine

## 2012-03-04 DIAGNOSIS — J069 Acute upper respiratory infection, unspecified: Secondary | ICD-10-CM | POA: Insufficient documentation

## 2012-03-04 MED ORDER — DOXYCYCLINE HYCLATE 50 MG PO CAPS: 100.0000 mg | ORAL_CAPSULE | Freq: Two times a day (BID) | ORAL | Status: DC

## 2012-03-04 MED ORDER — DOXYCYCLINE HYCLATE 100 MG PO TABS: 100.0000 mg | ORAL_TABLET | Freq: Two times a day (BID) | ORAL | Status: AC

## 2012-03-04 NOTE — Assessment & Plan Note (Signed)
Given abx to hold. Begin abx if sx's do not improve after total duration of 8-10 days. Followup if no improvement or worsening.  
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
## 2012-03-12 DIAGNOSIS — R05 Cough: Secondary | ICD-10-CM | POA: Diagnosis not present

## 2012-03-12 DIAGNOSIS — E119 Type 2 diabetes mellitus without complications: Secondary | ICD-10-CM | POA: Diagnosis not present

## 2012-03-12 DIAGNOSIS — E789 Disorder of lipoprotein metabolism, unspecified: Secondary | ICD-10-CM | POA: Diagnosis not present

## 2012-03-15 ENCOUNTER — Other Ambulatory Visit: Payer: Self-pay | Admitting: Internal Medicine

## 2012-03-16 NOTE — Telephone Encounter (Signed)
Rx refill sent to pharmacy. 

## 2012-04-01 ENCOUNTER — Telehealth: Payer: Self-pay | Admitting: Internal Medicine

## 2012-04-01 MED ORDER — ISOSORB DINITRATE-HYDRALAZINE 20-37.5 MG PO TABS: 1.0000 | ORAL_TABLET | Freq: Two times a day (BID) | ORAL | Status: DC

## 2012-04-01 MED ORDER — SIMVASTATIN 20 MG PO TABS: 20.0000 mg | ORAL_TABLET | Freq: Every evening | ORAL | Status: DC

## 2012-04-01 MED ORDER — AMLODIPINE BESYLATE 10 MG PO TABS: 10.0000 mg | ORAL_TABLET | Freq: Every day | ORAL | Status: DC

## 2012-04-01 MED ORDER — METOPROLOL SUCCINATE ER 50 MG PO TB24: 50.0000 mg | ORAL_TABLET | Freq: Every day | ORAL | Status: DC

## 2012-04-01 NOTE — Telephone Encounter (Signed)
Rx refills sent to pharmacy. 

## 2012-04-01 NOTE — Telephone Encounter (Signed)
Simvastatin 20mg  tab. Take one tablet every evening. Qty 90  Amlodipine 10mg  tab. Take one tablet daily. Qty 90  Metoprolol er 50mg  tab. Take one tablet daily. Qty 90  Bidil. Take one tablet twice daily. Qty 180

## 2012-04-11 ENCOUNTER — Encounter (HOSPITAL_BASED_OUTPATIENT_CLINIC_OR_DEPARTMENT_OTHER): Payer: Self-pay | Admitting: *Deleted

## 2012-04-11 ENCOUNTER — Emergency Department (HOSPITAL_BASED_OUTPATIENT_CLINIC_OR_DEPARTMENT_OTHER)
Admission: EM | Admit: 2012-04-11 | Discharge: 2012-04-11 | Disposition: A | Discharge status: Home / Self Care | Payer: Medicare Other | Attending: Emergency Medicine | Admitting: Emergency Medicine

## 2012-04-11 DIAGNOSIS — Z794 Long term (current) use of insulin: Secondary | ICD-10-CM | POA: Insufficient documentation

## 2012-04-11 DIAGNOSIS — E119 Type 2 diabetes mellitus without complications: Secondary | ICD-10-CM | POA: Diagnosis not present

## 2012-04-11 DIAGNOSIS — L02419 Cutaneous abscess of limb, unspecified: Secondary | ICD-10-CM | POA: Diagnosis not present

## 2012-04-11 DIAGNOSIS — M79609 Pain in unspecified limb: Secondary | ICD-10-CM | POA: Diagnosis not present

## 2012-04-11 DIAGNOSIS — L0390 Cellulitis, unspecified: Secondary | ICD-10-CM

## 2012-04-11 DIAGNOSIS — I2510 Atherosclerotic heart disease of native coronary artery without angina pectoris: Secondary | ICD-10-CM | POA: Diagnosis not present

## 2012-04-11 DIAGNOSIS — I10 Essential (primary) hypertension: Secondary | ICD-10-CM | POA: Diagnosis not present

## 2012-04-11 DIAGNOSIS — I890 Lymphedema, not elsewhere classified: Secondary | ICD-10-CM | POA: Diagnosis not present

## 2012-04-11 DIAGNOSIS — G4733 Obstructive sleep apnea (adult) (pediatric): Secondary | ICD-10-CM | POA: Insufficient documentation

## 2012-04-11 DIAGNOSIS — E785 Hyperlipidemia, unspecified: Secondary | ICD-10-CM | POA: Insufficient documentation

## 2012-04-11 DIAGNOSIS — E039 Hypothyroidism, unspecified: Secondary | ICD-10-CM | POA: Diagnosis not present

## 2012-04-11 DIAGNOSIS — L03119 Cellulitis of unspecified part of limb: Secondary | ICD-10-CM | POA: Diagnosis not present

## 2012-04-11 LAB — CBC
Hemoglobin: 14.7 g/dL (ref 13.0–17.0)
MCH: 29.3 pg (ref 26.0–34.0)
Platelets: 137 10*3/uL — ABNORMAL LOW (ref 150–400)
RBC: 5.01 MIL/uL (ref 4.22–5.81)

## 2012-04-11 LAB — BASIC METABOLIC PANEL
BUN: 15 mg/dL (ref 6–23)
Chloride: 103 mEq/L (ref 96–112)
GFR calc Af Amer: 86 mL/min — ABNORMAL LOW (ref >90)
GFR calc non Af Amer: 74 mL/min — ABNORMAL LOW (ref >90)
Glucose, Bld: 328 mg/dL — ABNORMAL HIGH (ref 70–99)
Potassium: 4.7 mEq/L (ref 3.5–5.1)
Sodium: 139 mEq/L (ref 135–145)

## 2012-04-11 LAB — DIFFERENTIAL
Basophils Relative: 0 % (ref 0–1)
Eosinophils Absolute: 0.2 10*3/uL (ref 0.0–0.7)
Lymphs Abs: 1.7 10*3/uL (ref 0.7–4.0)
Monocytes Relative: 10 % (ref 3–12)
Neutro Abs: 10.9 10*3/uL — ABNORMAL HIGH (ref 1.7–7.7)
Neutrophils Relative %: 77 % (ref 43–77)

## 2012-04-11 MED ORDER — CLINDAMYCIN PHOSPHATE 900 MG/6ML IJ SOLN
INTRAMUSCULAR | Status: AC
  Administered 2012-04-11: 300 mg via INTRAVENOUS
  Filled 2012-04-11: qty 6

## 2012-04-11 MED ORDER — CLINDAMYCIN PHOSPHATE 300 MG/50ML IV SOLN
300.0000 mg | Freq: Once | INTRAVENOUS | Status: DC
Start: 2012-04-11 — End: 2012-04-12
  Filled 2012-04-11: qty 50

## 2012-04-11 MED ORDER — CLINDAMYCIN HCL 150 MG PO CAPS: 300.0000 mg | ORAL_CAPSULE | Freq: Three times a day (TID) | ORAL | Status: DC

## 2012-04-11 NOTE — ED Notes (Signed)
PT STATES HE IS ON A FLUID PILL, BUT THIS A.M. NOTICED RIGHT LOWER EXT WAS RED AND WARM TO TOUCH. C/o PAIN TO SAME.

## 2012-04-11 NOTE — Discharge Instructions (Signed)

## 2012-04-11 NOTE — ED Notes (Signed)
Pt was triaged by D. Trotter, RN 
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
## 2012-04-13 ENCOUNTER — Encounter: Payer: Self-pay | Admitting: Internal Medicine

## 2012-04-13 ENCOUNTER — Ambulatory Visit (INDEPENDENT_AMBULATORY_CARE_PROVIDER_SITE_OTHER): Payer: Medicare Other | Admitting: Internal Medicine

## 2012-04-13 VITALS — BP 146/60 | HR 65 | Temp 98.9°F | Resp 18 | Ht 68.0 in | Wt 317.0 lb

## 2012-04-13 DIAGNOSIS — L0291 Cutaneous abscess, unspecified: Secondary | ICD-10-CM

## 2012-04-13 DIAGNOSIS — L0390 Cellulitis, unspecified: Secondary | ICD-10-CM | POA: Diagnosis not present

## 2012-04-13 MED ORDER — CLINDAMYCIN HCL 150 MG PO CAPS: 300.0000 mg | ORAL_CAPSULE | Freq: Three times a day (TID) | ORAL | Status: DC

## 2012-04-13 NOTE — Assessment & Plan Note (Signed)
Stable. Extend abx for additional 5 days. Schedule close follow up in ~4 days.
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
## 2012-04-16 DIAGNOSIS — E291 Testicular hypofunction: Secondary | ICD-10-CM | POA: Diagnosis not present

## 2012-04-16 DIAGNOSIS — E789 Disorder of lipoprotein metabolism, unspecified: Secondary | ICD-10-CM | POA: Diagnosis not present

## 2012-04-17 ENCOUNTER — Ambulatory Visit (INDEPENDENT_AMBULATORY_CARE_PROVIDER_SITE_OTHER): Payer: Medicare Other | Admitting: Internal Medicine

## 2012-04-17 ENCOUNTER — Encounter: Payer: Self-pay | Admitting: Internal Medicine

## 2012-04-17 VITALS — BP 118/56 | HR 96 | Temp 97.9°F

## 2012-04-17 DIAGNOSIS — L0390 Cellulitis, unspecified: Secondary | ICD-10-CM | POA: Diagnosis not present

## 2012-04-17 DIAGNOSIS — L0291 Cutaneous abscess, unspecified: Secondary | ICD-10-CM | POA: Diagnosis not present

## 2012-04-17 MED ORDER — CLINDAMYCIN HCL 150 MG PO CAPS: 300.0000 mg | ORAL_CAPSULE | Freq: Three times a day (TID) | ORAL | Status: AC

## 2012-04-17 NOTE — Assessment & Plan Note (Signed)
Improving. Extend clindamycin course. F/u scheduled.
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
## 2012-04-22 ENCOUNTER — Ambulatory Visit (INDEPENDENT_AMBULATORY_CARE_PROVIDER_SITE_OTHER): Payer: Medicare Other | Admitting: Internal Medicine

## 2012-04-22 ENCOUNTER — Encounter: Payer: Self-pay | Admitting: Internal Medicine

## 2012-04-22 VITALS — BP 138/60 | HR 77 | Temp 98.7°F | Ht 68.0 in | Wt 316.0 lb

## 2012-04-22 DIAGNOSIS — L0390 Cellulitis, unspecified: Secondary | ICD-10-CM | POA: Diagnosis not present

## 2012-04-22 DIAGNOSIS — L0291 Cutaneous abscess, unspecified: Secondary | ICD-10-CM | POA: Diagnosis not present

## 2012-04-22 MED ORDER — AMOXICILLIN-POT CLAVULANATE 875-125 MG PO TABS: 1.0000 | ORAL_TABLET | Freq: Two times a day (BID) | ORAL | Status: DC

## 2012-04-22 MED ORDER — CEFTRIAXONE SODIUM 1 G IJ SOLR
1.0000 g | Freq: Once | INTRAMUSCULAR | Status: AC
  Administered 2012-04-22: 1 g via INTRAMUSCULAR
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
## 2012-04-22 NOTE — Assessment & Plan Note (Signed)
Clinically worsening after initial definite improvement. Administer rocephin IM (confirmed no pcn or cephalosporin allergy). Change clindamycin to augmentin. Schedule close f/u for re-evaluation. Instructed to present to hospital if worsening redness/swelling or f/c despite abx tx. States understanding.

## 2012-04-23 ENCOUNTER — Ambulatory Visit: Payer: Medicare Other | Admitting: Internal Medicine

## 2012-04-23 DIAGNOSIS — L0291 Cutaneous abscess, unspecified: Secondary | ICD-10-CM | POA: Diagnosis not present

## 2012-04-23 DIAGNOSIS — E119 Type 2 diabetes mellitus without complications: Secondary | ICD-10-CM | POA: Diagnosis not present

## 2012-04-23 DIAGNOSIS — E291 Testicular hypofunction: Secondary | ICD-10-CM | POA: Diagnosis not present

## 2012-04-27 ENCOUNTER — Ambulatory Visit: Payer: Medicare Other | Admitting: Internal Medicine

## 2012-04-28 ENCOUNTER — Encounter: Payer: Self-pay | Admitting: Internal Medicine

## 2012-04-28 ENCOUNTER — Ambulatory Visit (INDEPENDENT_AMBULATORY_CARE_PROVIDER_SITE_OTHER): Payer: Medicare Other | Admitting: Internal Medicine

## 2012-04-28 VITALS — BP 128/60 | HR 68 | Temp 97.1°F | Ht 68.0 in | Wt 313.0 lb

## 2012-04-28 DIAGNOSIS — L0390 Cellulitis, unspecified: Secondary | ICD-10-CM | POA: Diagnosis not present

## 2012-04-28 MED ORDER — AMOXICILLIN-POT CLAVULANATE 875-125 MG PO TABS: 1.0000 | ORAL_TABLET | Freq: Two times a day (BID) | ORAL | Status: AC
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
## 2012-04-28 NOTE — Assessment & Plan Note (Signed)
Improving with augementin. Extend course if any worsening sx's at end of current course.

## 2012-04-29 ENCOUNTER — Ambulatory Visit: Payer: Medicare Other | Admitting: Internal Medicine

## 2012-04-30 ENCOUNTER — Other Ambulatory Visit: Payer: Self-pay | Admitting: Internal Medicine

## 2012-04-30 NOTE — Telephone Encounter (Signed)
Rx refill sent to pharmacy. 

## 2012-05-06 ENCOUNTER — Ambulatory Visit: Payer: Medicare Other

## 2012-05-06 ENCOUNTER — Other Ambulatory Visit: Payer: Self-pay | Admitting: *Deleted

## 2012-05-06 DIAGNOSIS — E785 Hyperlipidemia, unspecified: Secondary | ICD-10-CM

## 2012-05-06 DIAGNOSIS — I10 Essential (primary) hypertension: Secondary | ICD-10-CM

## 2012-05-06 DIAGNOSIS — E039 Hypothyroidism, unspecified: Secondary | ICD-10-CM

## 2012-05-06 LAB — HEPATIC FUNCTION PANEL
ALT: 28 U/L (ref 0–53)
Bilirubin, Direct: 0.1 mg/dL (ref 0.0–0.3)
Total Bilirubin: 0.7 mg/dL (ref 0.3–1.2)

## 2012-05-06 LAB — TSH: TSH: 2.49 u[IU]/mL (ref 0.35–5.50)

## 2012-05-06 LAB — LIPID PANEL
Total CHOL/HDL Ratio: 4
VLDL: 24.2 mg/dL (ref 0.0–40.0)

## 2012-05-06 LAB — HEMOGLOBIN A1C: Hgb A1c MFr Bld: 9.7 % — ABNORMAL HIGH (ref 4.6–6.5)

## 2012-05-06 NOTE — Progress Notes (Signed)
Lab orders entered per [facility], patient at [facility]/SLS

## 2012-05-13 ENCOUNTER — Telehealth: Payer: Self-pay | Admitting: Internal Medicine

## 2012-05-13 ENCOUNTER — Ambulatory Visit (INDEPENDENT_AMBULATORY_CARE_PROVIDER_SITE_OTHER): Payer: Medicare Other | Admitting: Internal Medicine

## 2012-05-13 ENCOUNTER — Encounter: Payer: Self-pay | Admitting: Internal Medicine

## 2012-05-13 VITALS — BP 140/70 | HR 58 | Temp 97.9°F | Resp 20 | Ht 68.0 in | Wt 312.0 lb

## 2012-05-13 DIAGNOSIS — E039 Hypothyroidism, unspecified: Secondary | ICD-10-CM | POA: Diagnosis not present

## 2012-05-13 DIAGNOSIS — Z79899 Other long term (current) drug therapy: Secondary | ICD-10-CM

## 2012-05-13 DIAGNOSIS — E785 Hyperlipidemia, unspecified: Secondary | ICD-10-CM

## 2012-05-13 DIAGNOSIS — L0390 Cellulitis, unspecified: Secondary | ICD-10-CM

## 2012-05-13 DIAGNOSIS — L0291 Cutaneous abscess, unspecified: Secondary | ICD-10-CM

## 2012-05-13 DIAGNOSIS — I10 Essential (primary) hypertension: Secondary | ICD-10-CM

## 2012-05-13 MED ORDER — INSULIN PEN NEEDLE 30G X 8 MM MISC: 20.0000 | Freq: Four times a day (QID) | Status: DC

## 2012-05-13 NOTE — Assessment & Plan Note (Signed)
Stable. tsh nl. Continue current medication dosing.
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
## 2012-05-13 NOTE — Patient Instructions (Signed)
Please schedule fasting labs prior to next visit Cbc-401.9, chem7-v58.69, lipid/lft-272.4 and tsh/free t4-hypothyroidism 

## 2012-05-13 NOTE — Telephone Encounter (Signed)
Lab orders entered for October 2013. 

## 2012-05-13 NOTE — Assessment & Plan Note (Signed)
Remains uncontrolled. Samples of lantus and apidra provided. Plans close follow up with endocrinology. Copy of labs provided. Encouraged in dietary modification, exercise and wt loss.

## 2012-05-13 NOTE — Assessment & Plan Note (Signed)
resolved 

## 2012-05-15 DIAGNOSIS — E789 Disorder of lipoprotein metabolism, unspecified: Secondary | ICD-10-CM | POA: Diagnosis not present

## 2012-05-21 DIAGNOSIS — E291 Testicular hypofunction: Secondary | ICD-10-CM | POA: Diagnosis not present

## 2012-05-21 DIAGNOSIS — E789 Disorder of lipoprotein metabolism, unspecified: Secondary | ICD-10-CM | POA: Diagnosis not present

## 2012-05-21 DIAGNOSIS — E119 Type 2 diabetes mellitus without complications: Secondary | ICD-10-CM | POA: Diagnosis not present

## 2012-05-21 DIAGNOSIS — I10 Essential (primary) hypertension: Secondary | ICD-10-CM | POA: Diagnosis not present

## 2012-06-07 ENCOUNTER — Other Ambulatory Visit: Payer: Self-pay | Admitting: Internal Medicine

## 2012-06-08 NOTE — Telephone Encounter (Signed)
Rx refill sent to pharmacy. 

## 2012-06-15 DIAGNOSIS — E119 Type 2 diabetes mellitus without complications: Secondary | ICD-10-CM | POA: Diagnosis not present

## 2012-06-22 DIAGNOSIS — E789 Disorder of lipoprotein metabolism, unspecified: Secondary | ICD-10-CM | POA: Diagnosis not present

## 2012-06-22 DIAGNOSIS — E291 Testicular hypofunction: Secondary | ICD-10-CM | POA: Diagnosis not present

## 2012-06-24 ENCOUNTER — Other Ambulatory Visit: Payer: Self-pay | Admitting: Internal Medicine

## 2012-06-25 NOTE — Telephone Encounter (Signed)
Rx refill sent to pharmacy. 

## 2012-08-17 DIAGNOSIS — E119 Type 2 diabetes mellitus without complications: Secondary | ICD-10-CM | POA: Diagnosis not present

## 2012-08-24 DIAGNOSIS — E119 Type 2 diabetes mellitus without complications: Secondary | ICD-10-CM | POA: Diagnosis not present

## 2012-08-24 DIAGNOSIS — E789 Disorder of lipoprotein metabolism, unspecified: Secondary | ICD-10-CM | POA: Diagnosis not present

## 2012-08-24 DIAGNOSIS — I10 Essential (primary) hypertension: Secondary | ICD-10-CM | POA: Diagnosis not present

## 2012-08-31 ENCOUNTER — Other Ambulatory Visit: Payer: Self-pay | Admitting: Internal Medicine

## 2012-09-01 NOTE — Telephone Encounter (Signed)
Done/SLS 

## 2012-09-09 ENCOUNTER — Other Ambulatory Visit: Payer: Self-pay | Admitting: Internal Medicine

## 2012-09-10 ENCOUNTER — Telehealth: Payer: Self-pay | Admitting: *Deleted
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
## 2012-10-15 ENCOUNTER — Other Ambulatory Visit: Payer: Medicare Other

## 2012-10-15 ENCOUNTER — Ambulatory Visit: Payer: Medicare Other | Admitting: Internal Medicine

## 2012-10-15 DIAGNOSIS — Z79899 Other long term (current) drug therapy: Secondary | ICD-10-CM

## 2012-10-15 DIAGNOSIS — E785 Hyperlipidemia, unspecified: Secondary | ICD-10-CM

## 2012-10-15 DIAGNOSIS — E039 Hypothyroidism, unspecified: Secondary | ICD-10-CM

## 2012-10-15 DIAGNOSIS — I10 Essential (primary) hypertension: Secondary | ICD-10-CM

## 2012-10-15 LAB — HEPATIC FUNCTION PANEL
ALT: 23 U/L (ref 0–53)
AST: 23 U/L (ref 0–37)
Albumin: 3.5 g/dL (ref 3.5–5.2)
Alkaline Phosphatase: 50 U/L (ref 39–117)
Total Protein: 6.6 g/dL (ref 6.0–8.3)

## 2012-10-15 LAB — CBC WITH DIFFERENTIAL/PLATELET
Basophils Absolute: 0.1 10*3/uL (ref 0.0–0.1)
Basophils Relative: 1 % (ref 0.0–3.0)
Eosinophils Absolute: 0.6 10*3/uL (ref 0.0–0.7)
Lymphocytes Relative: 38.9 % (ref 12.0–46.0)
MCHC: 33 g/dL (ref 30.0–36.0)
MCV: 88.1 fl (ref 78.0–100.0)
Monocytes Absolute: 0.8 10*3/uL (ref 0.1–1.0)
Neutrophils Relative %: 43.7 % (ref 43.0–77.0)
Platelets: 157 10*3/uL (ref 150.0–400.0)
RBC: 5.29 Mil/uL (ref 4.22–5.81)
RDW: 14.6 % (ref 11.5–14.6)

## 2012-10-15 LAB — TSH: TSH: 4.96 u[IU]/mL (ref 0.35–5.50)

## 2012-10-15 LAB — BASIC METABOLIC PANEL
BUN: 18 mg/dL (ref 6–23)
CO2: 24 mEq/L (ref 19–32)
GFR: 70.95 mL/min (ref >60.00)
Glucose, Bld: 111 mg/dL — ABNORMAL HIGH (ref 70–99)
Potassium: 3.8 mEq/L (ref 3.5–5.1)

## 2012-10-15 LAB — LIPID PANEL
Cholesterol: 121 mg/dL (ref 0–200)
HDL: 27 mg/dL — ABNORMAL LOW (ref >39.00)
Triglycerides: 114 mg/dL (ref 0.0–149.0)

## 2012-10-15 NOTE — Addendum Note (Signed)
Addended by: Mervin Kung A on: 10/15/2012 09:24 AM   Modules accepted: Orders

## 2012-10-15 NOTE — Telephone Encounter (Signed)
Labs entered in error for [facility]. Orders re-entered for [facility].

## 2012-10-19 ENCOUNTER — Ambulatory Visit (INDEPENDENT_AMBULATORY_CARE_PROVIDER_SITE_OTHER): Payer: Medicare Other | Admitting: Internal Medicine

## 2012-10-19 ENCOUNTER — Encounter: Payer: Self-pay | Admitting: Internal Medicine

## 2012-10-19 VITALS — BP 142/82 | HR 80 | Temp 97.9°F | Resp 18 | Wt 297.2 lb

## 2012-10-19 DIAGNOSIS — I10 Essential (primary) hypertension: Secondary | ICD-10-CM

## 2012-10-19 DIAGNOSIS — Z23 Encounter for immunization: Secondary | ICD-10-CM | POA: Diagnosis not present

## 2012-10-19 DIAGNOSIS — L259 Unspecified contact dermatitis, unspecified cause: Secondary | ICD-10-CM

## 2012-10-19 DIAGNOSIS — E785 Hyperlipidemia, unspecified: Secondary | ICD-10-CM | POA: Diagnosis not present

## 2012-10-19 MED ORDER — TRIAMCINOLONE ACETONIDE 0.1 % EX CREA: TOPICAL_CREAM | Freq: Two times a day (BID) | CUTANEOUS | Status: DC

## 2012-10-19 NOTE — Patient Instructions (Signed)
Please schedule fasting labs prior to next visit chem7-v58.69, lipid/lft-272.4 and tsh/free t4-hypothyroidism 

## 2012-10-21 ENCOUNTER — Telehealth: Payer: Self-pay | Admitting: Internal Medicine

## 2012-10-21 NOTE — Telephone Encounter (Signed)
He got 4 tubes of the cream for his poison ivy.  He has used two tubes with no relief and the rash is looking more red and swollen  Please advise

## 2012-10-21 NOTE — Telephone Encounter (Signed)
LMOM with contact name & number to inform patient of MD instructions: pt should not have used [2] tubes of cream [as stated in message] in [2] days time, he is to be applying in thin layer on affected areas [as putting thicker amounts on skin will not make it more effective] and also the timeframe has not been sufficient for healing process; can offer 'low- dose' steroidal injection in office [d/t diabetic status] if patient feels needed/SLS

## 2012-10-22 ENCOUNTER — Ambulatory Visit (INDEPENDENT_AMBULATORY_CARE_PROVIDER_SITE_OTHER): Payer: Medicare Other | Admitting: *Deleted

## 2012-10-22 ENCOUNTER — Ambulatory Visit: Payer: Medicare Other | Admitting: Internal Medicine

## 2012-10-22 DIAGNOSIS — L255 Unspecified contact dermatitis due to plants, except food: Secondary | ICD-10-CM | POA: Diagnosis not present

## 2012-10-22 DIAGNOSIS — L237 Allergic contact dermatitis due to plants, except food: Secondary | ICD-10-CM

## 2012-10-22 MED ORDER — METHYLPREDNISOLONE ACETATE 80 MG/ML IJ SUSP
20.0000 mg | Freq: Once | INTRAMUSCULAR | Status: AC
  Administered 2012-10-22: 20 mg via INTRAMUSCULAR

## 2012-10-22 NOTE — Telephone Encounter (Signed)
Patient returned phone call and scheduled nurse visit for this afternoon.

## 2012-10-24 DIAGNOSIS — L259 Unspecified contact dermatitis, unspecified cause: Secondary | ICD-10-CM | POA: Insufficient documentation

## 2012-10-24 NOTE — Assessment & Plan Note (Signed)
Continue statin therapy. Advised HDL may improve with exercise

## 2012-10-24 NOTE — Assessment & Plan Note (Signed)
Attempt triamcinolone to affected area
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
## 2012-10-24 NOTE — Assessment & Plan Note (Signed)
Mild elevation associated with itching from poison ivy today. Normotensive on home monitoring. Continue current regimen

## 2012-11-17 DIAGNOSIS — E119 Type 2 diabetes mellitus without complications: Secondary | ICD-10-CM | POA: Diagnosis not present

## 2012-11-24 DIAGNOSIS — E119 Type 2 diabetes mellitus without complications: Secondary | ICD-10-CM | POA: Diagnosis not present

## 2012-11-24 DIAGNOSIS — E291 Testicular hypofunction: Secondary | ICD-10-CM | POA: Diagnosis not present

## 2012-11-24 DIAGNOSIS — E789 Disorder of lipoprotein metabolism, unspecified: Secondary | ICD-10-CM | POA: Diagnosis not present

## 2012-12-08 ENCOUNTER — Telehealth: Payer: Self-pay | Admitting: Internal Medicine

## 2012-12-08 ENCOUNTER — Other Ambulatory Visit: Payer: Self-pay | Admitting: *Deleted

## 2012-12-08 MED ORDER — TAMSULOSIN HCL 0.4 MG PO CAPS: 0.4000 mg | ORAL_CAPSULE | Freq: Every day | ORAL | Status: DC

## 2012-12-24 ENCOUNTER — Other Ambulatory Visit: Payer: Self-pay | Admitting: Internal Medicine

## 2012-12-24 DIAGNOSIS — E119 Type 2 diabetes mellitus without complications: Secondary | ICD-10-CM | POA: Diagnosis not present

## 2012-12-24 DIAGNOSIS — H02839 Dermatochalasis of unspecified eye, unspecified eyelid: Secondary | ICD-10-CM | POA: Diagnosis not present

## 2012-12-25 ENCOUNTER — Telehealth: Payer: Self-pay | Admitting: Internal Medicine

## 2012-12-25 MED ORDER — SIMVASTATIN 20 MG PO TABS: 20.0000 mg | ORAL_TABLET | Freq: Every evening | ORAL | Status: DC

## 2012-12-25 MED ORDER — LEVOTHYROXINE SODIUM 25 MCG PO TABS: ORAL_TABLET | ORAL | Status: DC

## 2012-12-25 NOTE — Telephone Encounter (Signed)
Refill- simvastatin 20mg  tab. Take one tablet every evening. Qty 90   Refill- levothyroxine tab. Take one tablet daily. Qty 90

## 2012-12-25 NOTE — Telephone Encounter (Signed)
Rx[s] to pharmacy/SLS 

## 2012-12-29 NOTE — Telephone Encounter (Signed)
Amlodipine 10 mg take 1 per day qty 90 bidil 37.5 mg take 2 per day qty 180 Torsemide 20mg  take 1 per day qty 90

## 2012-12-31 NOTE — Telephone Encounter (Signed)
Prescriptions have already been filled to [facility] 12.26.13/SLS

## 2013-01-18 DIAGNOSIS — E119 Type 2 diabetes mellitus without complications: Secondary | ICD-10-CM | POA: Diagnosis not present

## 2013-01-18 DIAGNOSIS — E789 Disorder of lipoprotein metabolism, unspecified: Secondary | ICD-10-CM | POA: Diagnosis not present

## 2013-01-25 DIAGNOSIS — E291 Testicular hypofunction: Secondary | ICD-10-CM | POA: Diagnosis not present

## 2013-01-25 DIAGNOSIS — I10 Essential (primary) hypertension: Secondary | ICD-10-CM | POA: Diagnosis not present

## 2013-01-25 DIAGNOSIS — E119 Type 2 diabetes mellitus without complications: Secondary | ICD-10-CM | POA: Diagnosis not present

## 2013-01-25 DIAGNOSIS — E789 Disorder of lipoprotein metabolism, unspecified: Secondary | ICD-10-CM | POA: Diagnosis not present

## 2013-01-29 DIAGNOSIS — H02839 Dermatochalasis of unspecified eye, unspecified eyelid: Secondary | ICD-10-CM | POA: Diagnosis not present

## 2013-01-29 DIAGNOSIS — E119 Type 2 diabetes mellitus without complications: Secondary | ICD-10-CM | POA: Diagnosis not present

## 2013-02-02 DIAGNOSIS — H02839 Dermatochalasis of unspecified eye, unspecified eyelid: Secondary | ICD-10-CM | POA: Diagnosis not present

## 2013-02-26 ENCOUNTER — Other Ambulatory Visit: Payer: Self-pay | Admitting: *Deleted

## 2013-02-26 DIAGNOSIS — M79609 Pain in unspecified limb: Secondary | ICD-10-CM

## 2013-02-26 DIAGNOSIS — M7989 Other specified soft tissue disorders: Secondary | ICD-10-CM

## 2013-03-01 DIAGNOSIS — H02839 Dermatochalasis of unspecified eye, unspecified eyelid: Secondary | ICD-10-CM | POA: Diagnosis not present

## 2013-03-04 ENCOUNTER — Ambulatory Visit (HOSPITAL_BASED_OUTPATIENT_CLINIC_OR_DEPARTMENT_OTHER)
Admission: RE | Admit: 2013-03-04 | Discharge: 2013-03-04 | Disposition: A | Discharge status: Home / Self Care | Payer: Medicare Other | Source: Ambulatory Visit | Attending: Family | Admitting: Family

## 2013-03-04 ENCOUNTER — Encounter: Payer: Self-pay | Admitting: Family

## 2013-03-04 ENCOUNTER — Telehealth: Payer: Self-pay | Admitting: *Deleted

## 2013-03-04 ENCOUNTER — Ambulatory Visit (INDEPENDENT_AMBULATORY_CARE_PROVIDER_SITE_OTHER): Payer: Medicare Other | Admitting: Family

## 2013-03-04 ENCOUNTER — Other Ambulatory Visit: Payer: Self-pay | Admitting: Family

## 2013-03-04 VITALS — BP 134/60 | HR 62 | Temp 97.8°F | Resp 16 | Ht 68.0 in

## 2013-03-04 DIAGNOSIS — R350 Frequency of micturition: Secondary | ICD-10-CM | POA: Diagnosis not present

## 2013-03-04 DIAGNOSIS — R238 Other skin changes: Secondary | ICD-10-CM

## 2013-03-04 DIAGNOSIS — Z0000 Encounter for general adult medical examination without abnormal findings: Secondary | ICD-10-CM

## 2013-03-04 DIAGNOSIS — Z01818 Encounter for other preprocedural examination: Secondary | ICD-10-CM

## 2013-03-04 DIAGNOSIS — I519 Heart disease, unspecified: Secondary | ICD-10-CM

## 2013-03-04 LAB — CBC WITH DIFFERENTIAL/PLATELET
Basophils Absolute: 0.1 10*3/uL (ref 0.0–0.1)
Eosinophils Relative: 7 % — ABNORMAL HIGH (ref 0–5)
HCT: 48.5 % (ref 39.0–52.0)
Lymphocytes Relative: 34 % (ref 12–46)
Lymphs Abs: 2.1 10*3/uL (ref 0.7–4.0)
MCV: 84.1 fL (ref 78.0–100.0)
Monocytes Absolute: 0.5 10*3/uL (ref 0.1–1.0)
Neutro Abs: 3.1 10*3/uL (ref 1.7–7.7)
Platelets: 149 10*3/uL — ABNORMAL LOW (ref 150–400)
RBC: 5.77 MIL/uL (ref 4.22–5.81)
RDW: 14.4 % (ref 11.5–15.5)
WBC: 6.3 10*3/uL (ref 4.0–10.5)

## 2013-03-04 LAB — HEPATIC FUNCTION PANEL
Albumin: 4.1 g/dL (ref 3.5–5.2)
Total Bilirubin: 0.4 mg/dL (ref 0.3–1.2)
Total Protein: 6.8 g/dL (ref 6.0–8.3)

## 2013-03-04 MED ORDER — DICLOFENAC SODIUM 1 % TD GEL: 2.0000 g | Freq: Three times a day (TID) | TRANSDERMAL | Status: DC

## 2013-03-04 NOTE — Telephone Encounter (Signed)
Pt states Dr Hoy Register office has not heard back from cardiology referral and he requests that we refer him to someone for cardiology clearance.  Please advise.

## 2013-03-04 NOTE — Assessment & Plan Note (Signed)
Will obtain BMET, fair control per pt report. This is managed by Endo.

## 2013-03-04 NOTE — Assessment & Plan Note (Signed)
Obtain labs as below including PT/INR, CXR.  He will definitely need cardiac clearance prior to surgery.  Ultimately, I think he should be on an asprin 81 mg once daily as well, though this will need to be held prior to surgery.

## 2013-03-04 NOTE — Patient Instructions (Addendum)
Please complete your lab work prior to leaving. Complete cxr on the first floor.  Follow up in 3 months.

## 2013-03-04 NOTE — Telephone Encounter (Signed)
Preop EKG not done today. Left message on pt's cell# to call re: need to return for EKG. Spoke with Provider and determined that pt will be seeing cardiology and may get EKG done with them. We will need to have pt request that copy of EKG be sent to [place] once it is completed.
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
## 2013-03-05 LAB — URINALYSIS, ROUTINE W REFLEX MICROSCOPIC
Ketones, ur: NEGATIVE mg/dL
Nitrite: NEGATIVE
Specific Gravity, Urine: 1.023 (ref 1.005–1.030)
Urobilinogen, UA: 0.2 mg/dL (ref 0.0–1.0)
pH: 5 (ref 5.0–8.0)

## 2013-03-05 LAB — BASIC METABOLIC PANEL WITH GFR
CO2: 27 mEq/L (ref 19–32)
Chloride: 104 mEq/L (ref 96–112)
GFR, Est Non African American: 81 mL/min
Sodium: 139 mEq/L (ref 135–145)

## 2013-03-05 LAB — URINALYSIS, MICROSCOPIC ONLY
Bacteria, UA: NONE SEEN
Crystals: NONE SEEN
Squamous Epithelial / LPF: NONE SEEN

## 2013-03-05 LAB — PROTIME-INR: INR: 1 (ref <1.50)

## 2013-03-06 LAB — URINE CULTURE: Colony Count: 30000

## 2013-03-07 ENCOUNTER — Other Ambulatory Visit: Payer: Self-pay | Admitting: Internal Medicine

## 2013-03-09 ENCOUNTER — Telehealth: Payer: Self-pay | Admitting: Family

## 2013-03-09 MED ORDER — AMOXICILLIN 500 MG PO CAPS: 500.0000 mg | ORAL_CAPSULE | Freq: Three times a day (TID) | ORAL | Status: DC

## 2013-03-09 NOTE — Telephone Encounter (Signed)
Notified pt and he voices understanding. 

## 2013-03-09 NOTE — Telephone Encounter (Signed)
Pls call pt and let him know that he has small amount of bacteria in his urine.  I want him to take 1 week of antibiotics.  I have cleared him from medical stand point, however he still needs the cardiac and vascular clearance.

## 2013-03-10 NOTE — Telephone Encounter (Signed)
Surgical clearance form faxed to Dr Cleophas Dunker at fax) [phone],  [phone].

## 2013-03-25 ENCOUNTER — Encounter: Payer: Self-pay | Admitting: Vascular Surgery

## 2013-03-26 ENCOUNTER — Ambulatory Visit (INDEPENDENT_AMBULATORY_CARE_PROVIDER_SITE_OTHER): Payer: Medicare Other | Admitting: Vascular Surgery

## 2013-03-26 ENCOUNTER — Encounter: Payer: Self-pay | Admitting: Vascular Surgery

## 2013-03-26 ENCOUNTER — Encounter (INDEPENDENT_AMBULATORY_CARE_PROVIDER_SITE_OTHER): Payer: Medicare Other | Admitting: *Deleted

## 2013-03-26 VITALS — BP 148/55 | HR 56 | Ht 68.0 in | Wt 300.0 lb

## 2013-03-26 DIAGNOSIS — M79609 Pain in unspecified limb: Secondary | ICD-10-CM

## 2013-03-26 DIAGNOSIS — I872 Venous insufficiency (chronic) (peripheral): Secondary | ICD-10-CM | POA: Diagnosis not present

## 2013-03-26 DIAGNOSIS — M7989 Other specified soft tissue disorders: Secondary | ICD-10-CM | POA: Diagnosis not present
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
## 2013-04-06 ENCOUNTER — Ambulatory Visit (INDEPENDENT_AMBULATORY_CARE_PROVIDER_SITE_OTHER): Payer: Medicare Other | Admitting: Cardiology

## 2013-04-06 ENCOUNTER — Encounter: Payer: Self-pay | Admitting: Cardiology

## 2013-04-06 VITALS — BP 130/62 | HR 57 | Ht 68.0 in | Wt 297.8 lb

## 2013-04-06 DIAGNOSIS — E785 Hyperlipidemia, unspecified: Secondary | ICD-10-CM

## 2013-04-06 DIAGNOSIS — I10 Essential (primary) hypertension: Secondary | ICD-10-CM | POA: Diagnosis not present

## 2013-04-06 DIAGNOSIS — Z01810 Encounter for preprocedural cardiovascular examination: Secondary | ICD-10-CM

## 2013-04-06 DIAGNOSIS — I2510 Atherosclerotic heart disease of native coronary artery without angina pectoris: Secondary | ICD-10-CM

## 2013-04-06 NOTE — Assessment & Plan Note (Signed)
Continue statin. 

## 2013-04-06 NOTE — Patient Instructions (Addendum)
Your physician recommends that you schedule a follow-up appointment in:  AS NEEDED PENDING TEST RESULTS  Your physician has requested that you have a lexiscan myoview. For further information please visit [email or web address]. Please follow instruction sheet, as given.   
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
## 2013-04-06 NOTE — Assessment & Plan Note (Signed)
Continue present blood pressure medications. 

## 2013-04-06 NOTE — Assessment & Plan Note (Signed)
Patient has multiple risk factors including 15 years of diabetes mellitus, strong family history of coronary disease, hypertension and hyperlipidemia. Despite limited mobility because of his knee he also has some dyspnea on exertion. Plan Lexiscan Myoview for risk stratification. If normal he may proceed with surgery.

## 2013-04-07 ENCOUNTER — Ambulatory Visit (HOSPITAL_COMMUNITY): Payer: Medicare Other | Attending: Cardiovascular Disease | Admitting: Radiology

## 2013-04-07 VITALS — Ht 68.0 in | Wt 298.0 lb

## 2013-04-07 DIAGNOSIS — Z794 Long term (current) use of insulin: Secondary | ICD-10-CM | POA: Insufficient documentation

## 2013-04-07 DIAGNOSIS — E669 Obesity, unspecified: Secondary | ICD-10-CM | POA: Diagnosis not present

## 2013-04-07 DIAGNOSIS — I10 Essential (primary) hypertension: Secondary | ICD-10-CM | POA: Diagnosis not present

## 2013-04-07 DIAGNOSIS — R42 Dizziness and giddiness: Secondary | ICD-10-CM | POA: Diagnosis not present

## 2013-04-07 DIAGNOSIS — E119 Type 2 diabetes mellitus without complications: Secondary | ICD-10-CM | POA: Diagnosis not present

## 2013-04-07 DIAGNOSIS — Z8249 Family history of ischemic heart disease and other diseases of the circulatory system: Secondary | ICD-10-CM | POA: Diagnosis not present

## 2013-04-07 DIAGNOSIS — Z87891 Personal history of nicotine dependence: Secondary | ICD-10-CM | POA: Insufficient documentation

## 2013-04-07 DIAGNOSIS — I2510 Atherosclerotic heart disease of native coronary artery without angina pectoris: Secondary | ICD-10-CM

## 2013-04-07 DIAGNOSIS — R0602 Shortness of breath: Secondary | ICD-10-CM | POA: Diagnosis not present

## 2013-04-07 DIAGNOSIS — R0989 Other specified symptoms and signs involving the circulatory and respiratory systems: Secondary | ICD-10-CM | POA: Insufficient documentation

## 2013-04-07 DIAGNOSIS — R0609 Other forms of dyspnea: Secondary | ICD-10-CM | POA: Diagnosis not present

## 2013-04-07 DIAGNOSIS — R5381 Other malaise: Secondary | ICD-10-CM | POA: Insufficient documentation

## 2013-04-07 MED ORDER — REGADENOSON 0.4 MG/5ML IV SOLN
0.4000 mg | Freq: Once | INTRAVENOUS | Status: AC
  Administered 2013-04-07: 0.4 mg via INTRAVENOUS

## 2013-04-07 MED ORDER — TECHNETIUM TC 99M SESTAMIBI GENERIC - CARDIOLITE
33.0000 | Freq: Once | INTRAVENOUS | Status: AC | PRN
  Administered 2013-04-07: 33 via INTRAVENOUS

## 2013-04-07 NOTE — Progress Notes (Signed)
[facility] SITE 3 NUCLEAR MED [address] [place], [place] [address] [phone]    Cardiology Nuclear Med Study  [name] [name] is a 72 y.o. male     MRN : [id number]     DOB: 06-28-1941  Procedure Date: 04/07/2013  Nuclear Med Background Indication for Stress Test:  Evaluation for Ischemia and Surgical Clearance Pending (L) TKR- Dr. [name] History: '09 Echo: EF=70-75%, > 20 years ago Cath: ? Finding, no records available, > 20 years ago GXT: Ok per patient Cardiac Risk Factors: Strong Family History - CAD, History of Smoking, Hypertension, IDDM Type 2, Lipids and Obesity  Symptoms:  Dizziness, DOE, Fatigue, Fatigue with Exertion and Rapid HR   Nuclear Pre-Procedure Caffeine/Decaff Intake:  None NPO After: 2:00am   Lungs:  clear O2 Sat: 95% on room air. IV 0.9% NS with Angio Cath:  22g  IV Site: R Antecubital  IV Started by:  Bonnita Levan, RN  Chest Size (in):  50+ Cup Size: n/a  Height: 5\' 8"  (1.727 m)  Weight:  298 lb (135.172 kg)  BMI:  Body mass index is 45.32 kg/(m^2). Tech Comments:  Patient took 1/2 dose Insulin this AM, BS @ 9 am = 160; Toprol held > 24 hrs    Nuclear Med Study 1 or 2 day study: 2 day  Stress Test Type:  Lexiscan  Reading MD: Dietrich Pates, MD  Order Authorizing Provider:  Olga Millers, MD  Resting Radionuclide: Technetium 26m Sestamibi  Resting Radionuclide Dose: 33.0 mCi  04/08/13  Stress Radionuclide:  Technetium 78m Sestamibi  Stress Radionuclide Dose: 33.0 mCi  04/07/13           Stress Protocol Rest HR: 63 Stress HR: 98  Rest BP: 138/61 Stress BP: 158/70  Exercise Time (min): n/a METS: n/a   Predicted Max HR: 149 bpm % Max HR: 65.77 bpm Rate Pressure Product: 21308   Dose of Adenosine (mg):  n/a Dose of Lexiscan: 0.4 mg  Dose of Atropine (mg): n/a Dose of Dobutamine: n/a mcg/kg/min (at max HR)  Stress Test Technologist: Irean Hong, RN  Nuclear Technologist:  Domenic Polite, CNMT     Rest Procedure:  Myocardial  perfusion imaging was performed at rest 45 minutes following the intravenous administration of Technetium 36m Sestamibi. Rest ECG: NSR - Normal EKG  Stress Procedure:  The patient received IV Lexiscan 0.4 mg over 15-seconds.  Technetium 30m Sestamibi injected at 30-seconds.The patient complained of chest tightness with Lexiscan.  Quantitative spect images were obtained after a 45 minute delay. Stress ECG: No significant change from baseline ECG  QPS Raw Data Images:  SOft tissue (diaphragm, bowel activity, subcutaneous fat) surround heart. Stress Images:  Normal homogeneous uptake in all areas of the myocardium. Rest Images:  Normal homogeneous uptake in all areas of the myocardium. Subtraction (SDS):  No evidence of ischemia. Transient Ischemic Dilatation (Normal <1.22):  1.00 Lung/Heart Ratio (Normal <0.45):  0.15  Quantitative Gated Spect Images QGS EDV:  126 ml QGS ESV:  54 ml  Impression Exercise Capacity:  Lexiscan with no exercise. BP Response:  Normal blood pressure response. Clinical Symptoms:  Mild chest pain/dyspnea. ECG Impression:  No significant ST segment change suggestive of ischemia. Comparison with Prior Nuclear Study: No previous nuclear study performed  Overall Impression:  Normal stress nuclear study.  LV Ejection Fraction: 58%.  LV Wall Motion:  NL LV Function; NL Wall Motion  Dietrich Pates

## 2013-04-08 ENCOUNTER — Ambulatory Visit (HOSPITAL_COMMUNITY): Payer: Medicare Other | Attending: Internal Medicine

## 2013-04-08 DIAGNOSIS — R0989 Other specified symptoms and signs involving the circulatory and respiratory systems: Secondary | ICD-10-CM

## 2013-04-08 MED ORDER — TECHNETIUM TC 99M SESTAMIBI GENERIC - CARDIOLITE
33.0000 | Freq: Once | INTRAVENOUS | Status: AC | PRN
  Administered 2013-04-08: 33 via INTRAVENOUS

## 2013-04-13 ENCOUNTER — Telehealth: Payer: Self-pay | Admitting: Cardiology

## 2013-04-13 NOTE — Telephone Encounter (Signed)
New problem ° ° ° °Pt returning your call. Please call °

## 2013-04-13 NOTE — Telephone Encounter (Signed)
Spoke with pt, aware of nuclear results. Forwarded to dr Cleophas Dunker

## 2013-04-19 ENCOUNTER — Ambulatory Visit: Payer: Medicare Other | Admitting: Internal Medicine

## 2013-04-19 ENCOUNTER — Ambulatory Visit: Payer: Medicare Other | Admitting: Family Medicine

## 2013-04-22 ENCOUNTER — Telehealth: Payer: Self-pay | Admitting: Cardiology

## 2013-04-22 NOTE — Telephone Encounter (Signed)
Form is here and will fax once signed by dr Jens Som tomorrow

## 2013-04-22 NOTE — Telephone Encounter (Signed)
New problem     Per pt you were sending a form to dr whitfield at [facility] orthopedics stating it was ok to have surgery-per dr Hoy Register office they have not received this form

## 2013-04-22 NOTE — Telephone Encounter (Signed)
Left message for kim at [facility] orthopedic to fax form or call me.

## 2013-04-23 ENCOUNTER — Telehealth: Payer: Self-pay | Admitting: Cardiology

## 2013-04-23 NOTE — Telephone Encounter (Signed)
Received request from Nurse, documents faxed for surgical clearance on 04/23/13 rmf. To: [employer] number: [phone]

## 2013-04-30 ENCOUNTER — Encounter (HOSPITAL_COMMUNITY): Payer: Self-pay | Admitting: Pharmacy Technician

## 2013-04-30 DIAGNOSIS — M1710 Unilateral primary osteoarthritis, unspecified knee: Secondary | ICD-10-CM | POA: Diagnosis not present

## 2013-05-04 ENCOUNTER — Encounter (HOSPITAL_COMMUNITY)
Admission: RE | Admit: 2013-05-04 | Discharge: 2013-05-04 | Disposition: A | Discharge status: Home / Self Care | Payer: Medicare Other | Source: Ambulatory Visit | Attending: Orthopaedic Surgery | Admitting: Orthopaedic Surgery

## 2013-05-04 ENCOUNTER — Encounter (HOSPITAL_COMMUNITY): Payer: Self-pay

## 2013-05-04 DIAGNOSIS — I10 Essential (primary) hypertension: Secondary | ICD-10-CM | POA: Diagnosis present

## 2013-05-04 DIAGNOSIS — Z79899 Other long term (current) drug therapy: Secondary | ICD-10-CM | POA: Diagnosis not present

## 2013-05-04 DIAGNOSIS — Z8249 Family history of ischemic heart disease and other diseases of the circulatory system: Secondary | ICD-10-CM | POA: Diagnosis not present

## 2013-05-04 DIAGNOSIS — E871 Hypo-osmolality and hyponatremia: Secondary | ICD-10-CM | POA: Diagnosis present

## 2013-05-04 DIAGNOSIS — I2510 Atherosclerotic heart disease of native coronary artery without angina pectoris: Secondary | ICD-10-CM | POA: Diagnosis present

## 2013-05-04 DIAGNOSIS — K219 Gastro-esophageal reflux disease without esophagitis: Secondary | ICD-10-CM | POA: Diagnosis present

## 2013-05-04 DIAGNOSIS — Z794 Long term (current) use of insulin: Secondary | ICD-10-CM | POA: Diagnosis not present

## 2013-05-04 DIAGNOSIS — Z7901 Long term (current) use of anticoagulants: Secondary | ICD-10-CM | POA: Diagnosis not present

## 2013-05-04 DIAGNOSIS — Z01812 Encounter for preprocedural laboratory examination: Secondary | ICD-10-CM | POA: Diagnosis not present

## 2013-05-04 DIAGNOSIS — G8918 Other acute postprocedural pain: Secondary | ICD-10-CM | POA: Diagnosis not present

## 2013-05-04 DIAGNOSIS — E785 Hyperlipidemia, unspecified: Secondary | ICD-10-CM | POA: Diagnosis present

## 2013-05-04 DIAGNOSIS — E039 Hypothyroidism, unspecified: Secondary | ICD-10-CM | POA: Diagnosis present

## 2013-05-04 DIAGNOSIS — D62 Acute posthemorrhagic anemia: Secondary | ICD-10-CM | POA: Diagnosis not present

## 2013-05-04 DIAGNOSIS — Z6841 Body Mass Index (BMI) 40.0 and over, adult: Secondary | ICD-10-CM | POA: Diagnosis not present

## 2013-05-04 DIAGNOSIS — M1710 Unilateral primary osteoarthritis, unspecified knee: Secondary | ICD-10-CM | POA: Diagnosis not present

## 2013-05-04 DIAGNOSIS — Z833 Family history of diabetes mellitus: Secondary | ICD-10-CM | POA: Diagnosis not present

## 2013-05-04 DIAGNOSIS — Z8261 Family history of arthritis: Secondary | ICD-10-CM | POA: Diagnosis not present

## 2013-05-04 DIAGNOSIS — Z8701 Personal history of pneumonia (recurrent): Secondary | ICD-10-CM | POA: Diagnosis not present

## 2013-05-04 DIAGNOSIS — E1142 Type 2 diabetes mellitus with diabetic polyneuropathy: Secondary | ICD-10-CM | POA: Diagnosis not present

## 2013-05-04 DIAGNOSIS — G4733 Obstructive sleep apnea (adult) (pediatric): Secondary | ICD-10-CM | POA: Diagnosis present

## 2013-05-04 DIAGNOSIS — E1149 Type 2 diabetes mellitus with other diabetic neurological complication: Secondary | ICD-10-CM | POA: Diagnosis not present

## 2013-05-04 HISTORY — DX: Gastro-esophageal reflux disease without esophagitis: K21.9

## 2013-05-04 HISTORY — DX: Unspecified osteoarthritis, unspecified site: M19.90

## 2013-05-04 LAB — URINALYSIS, ROUTINE W REFLEX MICROSCOPIC
Glucose, UA: >1000 mg/dL — AB
Hgb urine dipstick: NEGATIVE
Ketones, ur: NEGATIVE mg/dL
Leukocytes, UA: NEGATIVE
pH: 6 (ref 5.0–8.0)

## 2013-05-04 LAB — COMPREHENSIVE METABOLIC PANEL
ALT: 21 U/L (ref 0–53)
AST: 24 U/L (ref 0–37)
Alkaline Phosphatase: 66 U/L (ref 39–117)
CO2: 25 mEq/L (ref 19–32)
Calcium: 9.4 mg/dL (ref 8.4–10.5)
GFR calc non Af Amer: 85 mL/min — ABNORMAL LOW (ref >90)
Potassium: 4.1 mEq/L (ref 3.5–5.1)
Sodium: 140 mEq/L (ref 135–145)
Total Protein: 7 g/dL (ref 6.0–8.3)

## 2013-05-04 LAB — CBC
Hemoglobin: 16.2 g/dL (ref 13.0–17.0)
MCH: 30.1 pg (ref 26.0–34.0)
Platelets: 147 10*3/uL — ABNORMAL LOW (ref 150–400)
RBC: 5.38 MIL/uL (ref 4.22–5.81)
WBC: 6.1 10*3/uL (ref 4.0–10.5)

## 2013-05-04 LAB — SURGICAL PCR SCREEN
MRSA, PCR: NEGATIVE
Staphylococcus aureus: POSITIVE — AB

## 2013-05-04 LAB — URINE MICROSCOPIC-ADD ON

## 2013-05-04 LAB — APTT: aPTT: 31 seconds (ref 24–37)

## 2013-05-04 NOTE — Progress Notes (Signed)
Dr. Hoy Register office notified that we do not have TED hose that will fit patient.  Requested copy of cardiac clearance from Dr. Jens Som.

## 2013-05-04 NOTE — Pre-Procedure Instructions (Signed)
[name] [name]  05/04/2013   Your procedure is scheduled on:  05-11-2013   Tuesday   Report to [facility] at 5:30  AM.  Call this number if you have problems the morning of surgery: [phone]   Remember:   Do not eat food or drink liquids after midnight.    Take these medicines the morning of surgery with A SIP OF WATER: Amlodipine,clonidine,isosorbide,synthroid,flomax,demadex              No Insulin or diabetic medications the morning of surgery   Do not wear jewelry,  Do not wear lotions, powders, or perfumes. You may wear deodorant.  Do not shave 48 hours prior to surgery. Men may shave face and neck.  Do not bring valuables to the hospital.  Contacts, dentures or bridgework may not be worn into surgery.  Leave suitcase in the car. After surgery it may be brought to your room.   For patients admitted to the hospital, checkout time is 11:00 AM the day of discharge.   Patients discharged the day of surgery will not be allowed to drive home.     Special Instructions: Shower using CHG 2 nights before surgery and the night before surgery.  If you shower the day of surgery use CHG.  Use special wash - you have one bottle of CHG for all showers.  You should use approximately 1/3 of the bottle for each shower.     Please read over the following fact sheets that you were given: Pain Booklet, Coughing and Deep Breathing, Blood Transfusion Information and Surgical Site Infection Prevention

## 2013-05-05 ENCOUNTER — Telehealth: Payer: Self-pay | Admitting: Internal Medicine

## 2013-05-05 DIAGNOSIS — R81 Glycosuria: Secondary | ICD-10-CM

## 2013-05-05 NOTE — Telephone Encounter (Signed)
Called and stated that PT has glucose levels higher than 1,000 in his urine culture. He stated that he was going to fax over lab results. Please assist.

## 2013-05-07 LAB — URINE CULTURE: Colony Count: 50000

## 2013-05-08 NOTE — Telephone Encounter (Signed)
So this note was in Dr Hodgin's box, noted on 5/10. Please call patient and see if he will come in to repeat a urinalysis, renal panel and have a hgba1c run for glucosuria and hyperglycemia. His blood sugar was mildly hi but his urine had very hi sugar. He is supposed to have surgery on 5/13. Tell him not to eat right before he comes in and to minimize carbs when he does eat. We have not met him yet.
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
## 2013-05-10 MED ORDER — DEXTROSE 5 % IV SOLN
3.0000 g | INTRAVENOUS | Status: AC
  Administered 2013-05-11: 3 g via INTRAVENOUS
  Filled 2013-05-10: qty 3000

## 2013-05-10 NOTE — Telephone Encounter (Signed)
Pt informed and stated that it would probably be about 3 weeks before he could come in due to getting knee replacement surgery tomorrow.  Orders placed

## 2013-05-11 ENCOUNTER — Inpatient Hospital Stay (HOSPITAL_COMMUNITY): Payer: Medicare Other | Admitting: Anesthesiology

## 2013-05-11 ENCOUNTER — Inpatient Hospital Stay (HOSPITAL_COMMUNITY)
Admission: RE | Admit: 2013-05-11 | Discharge: 2013-05-13 | DRG: 470 | Disposition: A | Discharge status: Home Health | Payer: Medicare Other | Source: Ambulatory Visit | Attending: Orthopaedic Surgery | Admitting: Orthopaedic Surgery

## 2013-05-11 ENCOUNTER — Encounter (HOSPITAL_COMMUNITY): Payer: Self-pay

## 2013-05-11 ENCOUNTER — Encounter (HOSPITAL_COMMUNITY)
Admission: RE | Disposition: A | Discharge status: Home Health | Payer: Self-pay | Source: Ambulatory Visit | Attending: Orthopaedic Surgery

## 2013-05-11 ENCOUNTER — Encounter (HOSPITAL_COMMUNITY): Payer: Self-pay | Admitting: Anesthesiology

## 2013-05-11 DIAGNOSIS — K219 Gastro-esophageal reflux disease without esophagitis: Secondary | ICD-10-CM | POA: Diagnosis present

## 2013-05-11 DIAGNOSIS — Z8249 Family history of ischemic heart disease and other diseases of the circulatory system: Secondary | ICD-10-CM

## 2013-05-11 DIAGNOSIS — E039 Hypothyroidism, unspecified: Secondary | ICD-10-CM | POA: Diagnosis present

## 2013-05-11 DIAGNOSIS — D62 Acute posthemorrhagic anemia: Secondary | ICD-10-CM | POA: Diagnosis not present

## 2013-05-11 DIAGNOSIS — Z8261 Family history of arthritis: Secondary | ICD-10-CM

## 2013-05-11 DIAGNOSIS — G4733 Obstructive sleep apnea (adult) (pediatric): Secondary | ICD-10-CM | POA: Diagnosis present

## 2013-05-11 DIAGNOSIS — E1142 Type 2 diabetes mellitus with diabetic polyneuropathy: Secondary | ICD-10-CM | POA: Diagnosis present

## 2013-05-11 DIAGNOSIS — E785 Hyperlipidemia, unspecified: Secondary | ICD-10-CM | POA: Diagnosis present

## 2013-05-11 DIAGNOSIS — E871 Hypo-osmolality and hyponatremia: Secondary | ICD-10-CM | POA: Diagnosis not present

## 2013-05-11 DIAGNOSIS — Z79899 Other long term (current) drug therapy: Secondary | ICD-10-CM

## 2013-05-11 DIAGNOSIS — M1710 Unilateral primary osteoarthritis, unspecified knee: Principal | ICD-10-CM | POA: Diagnosis present

## 2013-05-11 DIAGNOSIS — Z7901 Long term (current) use of anticoagulants: Secondary | ICD-10-CM

## 2013-05-11 DIAGNOSIS — Z794 Long term (current) use of insulin: Secondary | ICD-10-CM

## 2013-05-11 DIAGNOSIS — M1712 Unilateral primary osteoarthritis, left knee: Secondary | ICD-10-CM | POA: Diagnosis present

## 2013-05-11 DIAGNOSIS — I10 Essential (primary) hypertension: Secondary | ICD-10-CM | POA: Diagnosis present

## 2013-05-11 DIAGNOSIS — Z01812 Encounter for preprocedural laboratory examination: Secondary | ICD-10-CM

## 2013-05-11 DIAGNOSIS — Z8701 Personal history of pneumonia (recurrent): Secondary | ICD-10-CM

## 2013-05-11 DIAGNOSIS — Z6841 Body Mass Index (BMI) 40.0 and over, adult: Secondary | ICD-10-CM

## 2013-05-11 DIAGNOSIS — I2510 Atherosclerotic heart disease of native coronary artery without angina pectoris: Secondary | ICD-10-CM | POA: Diagnosis present

## 2013-05-11 DIAGNOSIS — E1149 Type 2 diabetes mellitus with other diabetic neurological complication: Secondary | ICD-10-CM | POA: Diagnosis present

## 2013-05-11 DIAGNOSIS — Z833 Family history of diabetes mellitus: Secondary | ICD-10-CM

## 2013-05-11 HISTORY — PX: TOTAL KNEE ARTHROPLASTY: SHX125

## 2013-05-11 LAB — GLUCOSE, CAPILLARY
Glucose-Capillary: 180 mg/dL — ABNORMAL HIGH (ref 70–99)
Glucose-Capillary: 231 mg/dL — ABNORMAL HIGH (ref 70–99)
Glucose-Capillary: 276 mg/dL — ABNORMAL HIGH (ref 70–99)

## 2013-05-11 LAB — HEMOGLOBIN A1C: Hgb A1c MFr Bld: 9 % — ABNORMAL HIGH (ref <5.7)

## 2013-05-11 SURGERY — ARTHROPLASTY, KNEE, TOTAL
Anesthesia: General | Site: Knee | Laterality: Left | Wound class: Clean

## 2013-05-11 MED ORDER — THROMBIN 20000 UNITS EX KIT
PACK | CUTANEOUS | Status: DC | PRN
  Administered 2013-05-11: 20000 [IU] via TOPICAL

## 2013-05-11 MED ORDER — METOPROLOL SUCCINATE ER 50 MG PO TB24
50.0000 mg | ORAL_TABLET | Freq: Every evening | ORAL | Status: DC
  Administered 2013-05-11 – 2013-05-12 (×2): 50 mg via ORAL
  Filled 2013-05-11 (×3): qty 1

## 2013-05-11 MED ORDER — FLEET ENEMA 7-19 GM/118ML RE ENEM: 1.0000 | ENEMA | Freq: Once | RECTAL | Status: AC | PRN

## 2013-05-11 MED ORDER — PROPOFOL 10 MG/ML IV BOLUS
INTRAVENOUS | Status: DC | PRN
  Administered 2013-05-11: 200 mg via INTRAVENOUS

## 2013-05-11 MED ORDER — HYDROMORPHONE HCL PF 1 MG/ML IJ SOLN
INTRAMUSCULAR | Status: AC
  Filled 2013-05-11: qty 1

## 2013-05-11 MED ORDER — PHENOL 1.4 % MT LIQD: 1.0000 | OROMUCOSAL | Status: DC | PRN

## 2013-05-11 MED ORDER — FENTANYL CITRATE 0.05 MG/ML IJ SOLN
INTRAMUSCULAR | Status: DC | PRN
  Administered 2013-05-11: 150 ug via INTRAVENOUS
  Administered 2013-05-11: 50 ug via INTRAVENOUS
  Administered 2013-05-11 (×3): 100 ug via INTRAVENOUS

## 2013-05-11 MED ORDER — SODIUM CHLORIDE 0.9 % IV SOLN
75.0000 mL/h | INTRAVENOUS | Status: DC
  Administered 2013-05-11: 75 mL/h via INTRAVENOUS

## 2013-05-11 MED ORDER — ALUM & MAG HYDROXIDE-SIMETH 200-200-20 MG/5ML PO SUSP: 30.0000 mL | ORAL | Status: DC | PRN

## 2013-05-11 MED ORDER — ONDANSETRON HCL 4 MG/2ML IJ SOLN
INTRAMUSCULAR | Status: DC | PRN
  Administered 2013-05-11: 4 mg via INTRAVENOUS

## 2013-05-11 MED ORDER — SODIUM CHLORIDE 0.9 % IV SOLN: INTRAVENOUS | Status: DC

## 2013-05-11 MED ORDER — ONDANSETRON HCL 4 MG PO TABS: 4.0000 mg | ORAL_TABLET | Freq: Four times a day (QID) | ORAL | Status: DC | PRN

## 2013-05-11 MED ORDER — HYDROMORPHONE HCL PF 1 MG/ML IJ SOLN
0.5000 mg | INTRAMUSCULAR | Status: DC | PRN
  Administered 2013-05-11: 0.5 mg via INTRAVENOUS

## 2013-05-11 MED ORDER — BISACODYL 10 MG RE SUPP: 10.0000 mg | Freq: Every day | RECTAL | Status: DC | PRN

## 2013-05-11 MED ORDER — MAGNESIUM HYDROXIDE 400 MG/5ML PO SUSP: 30.0000 mL | Freq: Every day | ORAL | Status: DC | PRN

## 2013-05-11 MED ORDER — THROMBIN 20000 UNITS EX KIT
PACK | CUTANEOUS | Status: AC
  Filled 2013-05-11: qty 1

## 2013-05-11 MED ORDER — TAMSULOSIN HCL 0.4 MG PO CAPS
0.4000 mg | ORAL_CAPSULE | Freq: Every day | ORAL | Status: DC
  Administered 2013-05-11 – 2013-05-13 (×3): 0.4 mg via ORAL
  Filled 2013-05-11 (×3): qty 1

## 2013-05-11 MED ORDER — BUPIVACAINE-EPINEPHRINE PF 0.25-1:200000 % IJ SOLN
INTRAMUSCULAR | Status: AC
  Filled 2013-05-11: qty 30

## 2013-05-11 MED ORDER — CEFAZOLIN SODIUM-DEXTROSE 2-3 GM-% IV SOLR
2.0000 g | Freq: Four times a day (QID) | INTRAVENOUS | Status: AC
  Administered 2013-05-11 (×2): 2 g via INTRAVENOUS
  Filled 2013-05-11 (×2): qty 50

## 2013-05-11 MED ORDER — METHOCARBAMOL 100 MG/ML IJ SOLN
500.0000 mg | Freq: Four times a day (QID) | INTRAVENOUS | Status: DC | PRN
  Filled 2013-05-11: qty 5

## 2013-05-11 MED ORDER — INSULIN GLULISINE 100 UNIT/ML IJ SOLN
50.0000 [IU] | Freq: Every day | INTRAMUSCULAR | Status: DC
  Administered 2013-05-12: 50 [IU] via SUBCUTANEOUS
  Filled 2013-05-11 (×3): qty 0.5

## 2013-05-11 MED ORDER — RAMIPRIL 10 MG PO CAPS
10.0000 mg | ORAL_CAPSULE | Freq: Two times a day (BID) | ORAL | Status: DC
  Administered 2013-05-11 – 2013-05-13 (×5): 10 mg via ORAL
  Filled 2013-05-11 (×6): qty 1

## 2013-05-11 MED ORDER — GLYCOPYRROLATE 0.2 MG/ML IJ SOLN
INTRAMUSCULAR | Status: DC | PRN
  Administered 2013-05-11: 0.4 mg via INTRAVENOUS

## 2013-05-11 MED ORDER — MENTHOL 3 MG MT LOZG: 1.0000 | LOZENGE | OROMUCOSAL | Status: DC | PRN

## 2013-05-11 MED ORDER — OXYCODONE HCL 5 MG PO TABS
5.0000 mg | ORAL_TABLET | Freq: Once | ORAL | Status: AC | PRN
  Administered 2013-05-11: 5 mg via ORAL

## 2013-05-11 MED ORDER — OXYCODONE HCL 5 MG PO TABS
ORAL_TABLET | ORAL | Status: AC
  Administered 2013-05-12: 10 mg via ORAL
  Filled 2013-05-11: qty 1

## 2013-05-11 MED ORDER — LACTATED RINGERS IV SOLN
INTRAVENOUS | Status: DC | PRN
  Administered 2013-05-11 (×2): via INTRAVENOUS

## 2013-05-11 MED ORDER — INSULIN GLARGINE 100 UNIT/ML ~~LOC~~ SOLN: 30.0000 [IU] | Freq: Two times a day (BID) | SUBCUTANEOUS | Status: DC

## 2013-05-11 MED ORDER — SODIUM CHLORIDE 0.9 % IR SOLN
Status: DC | PRN
  Administered 2013-05-11: 3000 mL
  Administered 2013-05-11: 1000 mL

## 2013-05-11 MED ORDER — LIDOCAINE HCL 1 % IJ SOLN
INTRAMUSCULAR | Status: DC | PRN
  Administered 2013-05-11: 2 mL via INTRADERMAL

## 2013-05-11 MED ORDER — INSULIN GLARGINE 100 UNIT/ML ~~LOC~~ SOLN
30.0000 [IU] | Freq: Every day | SUBCUTANEOUS | Status: DC
  Administered 2013-05-11 – 2013-05-12 (×2): 30 [IU] via SUBCUTANEOUS
  Filled 2013-05-11 (×2): qty 0.3

## 2013-05-11 MED ORDER — METHOCARBAMOL 500 MG PO TABS
ORAL_TABLET | ORAL | Status: AC
  Administered 2013-05-11: 500 mg
  Filled 2013-05-11: qty 1

## 2013-05-11 MED ORDER — OXYCODONE HCL 5 MG/5ML PO SOLN: 5.0000 mg | Freq: Once | ORAL | Status: AC | PRN

## 2013-05-11 MED ORDER — LABETALOL HCL 5 MG/ML IV SOLN
INTRAVENOUS | Status: DC | PRN
  Administered 2013-05-11: 5 mg via INTRAVENOUS

## 2013-05-11 MED ORDER — NEOSTIGMINE METHYLSULFATE 1 MG/ML IJ SOLN
INTRAMUSCULAR | Status: DC | PRN
  Administered 2013-05-11: 3 mg via INTRAVENOUS

## 2013-05-11 MED ORDER — BUPIVACAINE-EPINEPHRINE 0.25% -1:200000 IJ SOLN
INTRAMUSCULAR | Status: DC | PRN
  Administered 2013-05-11: 30 mL

## 2013-05-11 MED ORDER — HYDROMORPHONE HCL PF 1 MG/ML IJ SOLN
0.2500 mg | INTRAMUSCULAR | Status: DC | PRN
  Administered 2013-05-11 (×2): 0.5 mg via INTRAVENOUS

## 2013-05-11 MED ORDER — DOCUSATE SODIUM 100 MG PO CAPS
100.0000 mg | ORAL_CAPSULE | Freq: Two times a day (BID) | ORAL | Status: DC
  Administered 2013-05-11 – 2013-05-13 (×5): 100 mg via ORAL
  Filled 2013-05-11 (×6): qty 1

## 2013-05-11 MED ORDER — ACETAMINOPHEN 10 MG/ML IV SOLN
INTRAVENOUS | Status: AC
  Filled 2013-05-11: qty 100

## 2013-05-11 MED ORDER — METOCLOPRAMIDE HCL 5 MG/ML IJ SOLN: 5.0000 mg | Freq: Three times a day (TID) | INTRAMUSCULAR | Status: DC | PRN

## 2013-05-11 MED ORDER — KETOROLAC TROMETHAMINE 15 MG/ML IJ SOLN
15.0000 mg | Freq: Four times a day (QID) | INTRAMUSCULAR | Status: AC
  Administered 2013-05-11 (×2): 15 mg via INTRAVENOUS
  Filled 2013-05-11 (×2): qty 1

## 2013-05-11 MED ORDER — ACETAMINOPHEN 10 MG/ML IV SOLN
1000.0000 mg | Freq: Once | INTRAVENOUS | Status: AC
  Administered 2013-05-11: 1000 mg via INTRAVENOUS
  Filled 2013-05-11: qty 100

## 2013-05-11 MED ORDER — INSULIN GLARGINE 100 UNIT/ML ~~LOC~~ SOLN
60.0000 [IU] | Freq: Every day | SUBCUTANEOUS | Status: DC
  Administered 2013-05-11: 60 [IU] via SUBCUTANEOUS
  Filled 2013-05-11 (×3): qty 0.6

## 2013-05-11 MED ORDER — ROPIVACAINE HCL 5 MG/ML IJ SOLN
INTRAMUSCULAR | Status: DC | PRN
  Administered 2013-05-11: 30 mL

## 2013-05-11 MED ORDER — AMLODIPINE BESYLATE 10 MG PO TABS
10.0000 mg | ORAL_TABLET | Freq: Every day | ORAL | Status: DC
  Administered 2013-05-12 – 2013-05-13 (×2): 10 mg via ORAL
  Filled 2013-05-11 (×2): qty 1

## 2013-05-11 MED ORDER — METOCLOPRAMIDE HCL 10 MG PO TABS: 5.0000 mg | ORAL_TABLET | Freq: Three times a day (TID) | ORAL | Status: DC | PRN

## 2013-05-11 MED ORDER — CLONIDINE HCL 0.1 MG PO TABS
0.1000 mg | ORAL_TABLET | Freq: Every day | ORAL | Status: DC
  Administered 2013-05-11 – 2013-05-12 (×2): 0.1 mg via ORAL
  Filled 2013-05-11 (×3): qty 1

## 2013-05-11 MED ORDER — RIVAROXABAN 10 MG PO TABS
10.0000 mg | ORAL_TABLET | ORAL | Status: DC
  Administered 2013-05-11 – 2013-05-12 (×2): 10 mg via ORAL
  Filled 2013-05-11 (×3): qty 1

## 2013-05-11 MED ORDER — LEVOTHYROXINE SODIUM 25 MCG PO TABS
25.0000 ug | ORAL_TABLET | Freq: Every day | ORAL | Status: DC
  Administered 2013-05-12 – 2013-05-13 (×2): 25 ug via ORAL
  Filled 2013-05-11 (×3): qty 1

## 2013-05-11 MED ORDER — ACETAMINOPHEN 10 MG/ML IV SOLN
1000.0000 mg | Freq: Four times a day (QID) | INTRAVENOUS | Status: AC
  Administered 2013-05-11 – 2013-05-12 (×4): 1000 mg via INTRAVENOUS
  Filled 2013-05-11 (×4): qty 100

## 2013-05-11 MED ORDER — ONDANSETRON HCL 4 MG/2ML IJ SOLN: 4.0000 mg | Freq: Four times a day (QID) | INTRAMUSCULAR | Status: DC | PRN

## 2013-05-11 MED ORDER — METOCLOPRAMIDE HCL 5 MG/ML IJ SOLN: 10.0000 mg | Freq: Once | INTRAMUSCULAR | Status: DC | PRN

## 2013-05-11 MED ORDER — MIDAZOLAM HCL 5 MG/5ML IJ SOLN
INTRAMUSCULAR | Status: DC | PRN
  Administered 2013-05-11: 2 mg via INTRAVENOUS

## 2013-05-11 MED ORDER — TORSEMIDE 20 MG PO TABS
40.0000 mg | ORAL_TABLET | Freq: Every day | ORAL | Status: DC
  Administered 2013-05-12: 40 mg via ORAL
  Filled 2013-05-11 (×2): qty 2

## 2013-05-11 MED ORDER — LIDOCAINE HCL (CARDIAC) 20 MG/ML IV SOLN
INTRAVENOUS | Status: DC | PRN
  Administered 2013-05-11: 40 mg via INTRAVENOUS

## 2013-05-11 MED ORDER — CHLORHEXIDINE GLUCONATE 4 % EX LIQD: 60.0000 mL | Freq: Every day | CUTANEOUS | Status: DC

## 2013-05-11 MED ORDER — ROCURONIUM BROMIDE 100 MG/10ML IV SOLN
INTRAVENOUS | Status: DC | PRN
  Administered 2013-05-11: 50 mg via INTRAVENOUS
  Administered 2013-05-11: 20 mg via INTRAVENOUS

## 2013-05-11 MED ORDER — CHLORHEXIDINE GLUCONATE 4 % EX LIQD: 60.0000 mL | Freq: Once | CUTANEOUS | Status: DC

## 2013-05-11 MED ORDER — METHOCARBAMOL 500 MG PO TABS
500.0000 mg | ORAL_TABLET | Freq: Four times a day (QID) | ORAL | Status: DC | PRN
  Administered 2013-05-11 – 2013-05-12 (×2): 500 mg via ORAL
  Filled 2013-05-11 (×3): qty 1

## 2013-05-11 MED ORDER — NON FORMULARY: 50.0000 [IU] | Freq: Two times a day (BID) | Status: DC

## 2013-05-11 MED ORDER — ISOSORB DINITRATE-HYDRALAZINE 20-37.5 MG PO TABS
1.0000 | ORAL_TABLET | Freq: Two times a day (BID) | ORAL | Status: DC
  Administered 2013-05-11 – 2013-05-13 (×4): 1 via ORAL
  Filled 2013-05-11 (×5): qty 1

## 2013-05-11 MED ORDER — INSULIN GLULISINE 100 UNIT/ML IJ SOLN
40.0000 [IU] | Freq: Every day | INTRAMUSCULAR | Status: DC
  Administered 2013-05-12 – 2013-05-13 (×2): 40 [IU] via SUBCUTANEOUS
  Filled 2013-05-11 (×6): qty 0.4

## 2013-05-11 MED ORDER — OXYCODONE HCL 5 MG PO TABS
5.0000 mg | ORAL_TABLET | ORAL | Status: DC | PRN
  Administered 2013-05-11 – 2013-05-13 (×6): 10 mg via ORAL
  Filled 2013-05-11 (×7): qty 2

## 2013-05-11 MED ORDER — INSULIN ASPART 100 UNIT/ML ~~LOC~~ SOLN
0.0000 [IU] | Freq: Three times a day (TID) | SUBCUTANEOUS | Status: DC
  Administered 2013-05-11: 8 [IU] via SUBCUTANEOUS
  Administered 2013-05-11: 5 [IU] via SUBCUTANEOUS
  Administered 2013-05-12: 15 [IU] via SUBCUTANEOUS
  Administered 2013-05-12: 8 [IU] via SUBCUTANEOUS

## 2013-05-11 MED ORDER — HYDROMORPHONE HCL PF 1 MG/ML IJ SOLN
INTRAMUSCULAR | Status: DC | PRN
  Administered 2013-05-11 (×2): 0.5 mg via INTRAVENOUS

## 2013-05-11 SURGICAL SUPPLY — 62 items
BANDAGE ELASTIC 4 VELCRO ST LF (GAUZE/BANDAGES/DRESSINGS) ×1 IMPLANT
BANDAGE ELASTIC 6 VELCRO ST LF (GAUZE/BANDAGES/DRESSINGS) ×1 IMPLANT
BANDAGE ESMARK 6X9 LF (GAUZE/BANDAGES/DRESSINGS) ×1 IMPLANT
BLADE SAGITTAL 25.0X1.19X90 (BLADE) ×2 IMPLANT
BNDG CMPR 9X6 STRL LF SNTH (GAUZE/BANDAGES/DRESSINGS) ×1
BNDG ESMARK 6X9 LF (GAUZE/BANDAGES/DRESSINGS) ×2
BOWL SMART MIX CTS (DISPOSABLE) ×2 IMPLANT
CEMENT HV SMART SET (Cement) ×4 IMPLANT
CLOTH BEACON ORANGE TIMEOUT ST (SAFETY) ×2 IMPLANT
COVER BACK TABLE 24X17X13 BIG (DRAPES) ×2 IMPLANT
COVER SURGICAL LIGHT HANDLE (MISCELLANEOUS) ×2 IMPLANT
CUFF TOURNIQUET SINGLE 34IN LL (TOURNIQUET CUFF) ×1 IMPLANT
CUFF TOURNIQUET SINGLE 44IN (TOURNIQUET CUFF) IMPLANT
DRAPE EXTREMITY T 121X128X90 (DRAPE) ×2 IMPLANT
DRAPE PROXIMA HALF (DRAPES) ×2 IMPLANT
DRSG ADAPTIC 3X8 NADH LF (GAUZE/BANDAGES/DRESSINGS) ×2 IMPLANT
DRSG PAD ABDOMINAL 8X10 ST (GAUZE/BANDAGES/DRESSINGS) ×4 IMPLANT
DURAPREP 26ML APPLICATOR (WOUND CARE) ×2 IMPLANT
ELECT CAUTERY BLADE 6.4 (BLADE) ×2 IMPLANT
ELECT REM PT RETURN 9FT ADLT (ELECTROSURGICAL) ×2
ELECTRODE REM PT RTRN 9FT ADLT (ELECTROSURGICAL) ×1 IMPLANT
EVACUATOR 1/8 PVC DRAIN (DRAIN) IMPLANT
FACESHIELD LNG OPTICON STERILE (SAFETY) ×4 IMPLANT
FLOSEAL 10ML (HEMOSTASIS) IMPLANT
GLOVE BIOGEL PI IND STRL 8 (GLOVE) ×1 IMPLANT
GLOVE BIOGEL PI IND STRL 8.5 (GLOVE) ×1 IMPLANT
GLOVE BIOGEL PI INDICATOR 8 (GLOVE) ×2
GLOVE BIOGEL PI INDICATOR 8.5 (GLOVE) ×1
GLOVE ECLIPSE 8.0 STRL XLNG CF (GLOVE) ×5 IMPLANT
GLOVE SURG ORTHO 8.5 STRL (GLOVE) ×3 IMPLANT
GOWN EXTRA PROTECTION XL (GOWNS) ×1 IMPLANT
GOWN PREVENTION PLUS XXLARGE (GOWN DISPOSABLE) ×2 IMPLANT
GOWN STRL NON-REIN LRG LVL3 (GOWN DISPOSABLE) ×4 IMPLANT
HANDPIECE INTERPULSE COAX TIP (DISPOSABLE) ×2
KIT BASIN OR (CUSTOM PROCEDURE TRAY) ×2 IMPLANT
KIT ROOM TURNOVER OR (KITS) ×2 IMPLANT
MANIFOLD NEPTUNE II (INSTRUMENTS) ×2 IMPLANT
NEEDLE 22X1 1/2 (OR ONLY) (NEEDLE) ×1 IMPLANT
NS IRRIG 1000ML POUR BTL (IV SOLUTION) ×2 IMPLANT
PACK TOTAL JOINT (CUSTOM PROCEDURE TRAY) ×2 IMPLANT
PAD ARMBOARD 7.5X6 YLW CONV (MISCELLANEOUS) ×4 IMPLANT
PAD CAST 4YDX4 CTTN HI CHSV (CAST SUPPLIES) ×1 IMPLANT
PADDING CAST COTTON 4X4 STRL (CAST SUPPLIES) ×2
PADDING CAST COTTON 6X4 STRL (CAST SUPPLIES) ×2 IMPLANT
SET HNDPC FAN SPRY TIP SCT (DISPOSABLE) ×1 IMPLANT
SPONGE GAUZE 4X4 12PLY (GAUZE/BANDAGES/DRESSINGS) ×2 IMPLANT
STAPLER VISISTAT 35W (STAPLE) ×2 IMPLANT
SUCTION FRAZIER TIP 10 FR DISP (SUCTIONS) ×2 IMPLANT
SUT BONE WAX W31G (SUTURE) ×2 IMPLANT
SUT ETHIBOND NAB CT1 #1 30IN (SUTURE) ×6 IMPLANT
SUT MNCRL AB 3-0 PS2 18 (SUTURE) ×2 IMPLANT
SUT VIC AB 0 CT1 27 (SUTURE) ×2
SUT VIC AB 0 CT1 27XBRD ANBCTR (SUTURE) ×1 IMPLANT
SUT VIC AB 1 CT1 27 (SUTURE) ×2
SUT VIC AB 1 CT1 27XBRD ANBCTR (SUTURE) ×1 IMPLANT
SYR CONTROL 10ML LL (SYRINGE) IMPLANT
TOWEL OR 17X24 6PK STRL BLUE (TOWEL DISPOSABLE) ×2 IMPLANT
TOWEL OR 17X26 10 PK STRL BLUE (TOWEL DISPOSABLE) ×2 IMPLANT
TRAY FOLEY CATH 14FR (SET/KITS/TRAYS/PACK) IMPLANT
TRAY FOLEY CATH 16FR SILVER (SET/KITS/TRAYS/PACK) ×1 IMPLANT
WATER STERILE IRR 1000ML POUR (IV SOLUTION) ×3 IMPLANT
WRAP KNEE MAXI GEL POST OP (GAUZE/BANDAGES/DRESSINGS) ×2 IMPLANT

## 2013-05-11 NOTE — H&P (Signed)
  The recent History & Physical has been reviewed. I have personally examined the patient today. There is no interval change to the documented History & Physical. The patient would like to proceed with the procedure.  [name] W 05/11/2013,  7:23 AM

## 2013-05-11 NOTE — Progress Notes (Signed)
Patient has calf 23". No teds to fit.

## 2013-05-11 NOTE — Progress Notes (Signed)
UR COMPLETED  

## 2013-05-11 NOTE — Transfer of Care (Signed)
Immediate Anesthesia Transfer of Care Note  Patient: [name] [name]  Procedure(s) Performed: Procedure(s) with comments: TOTAL KNEE ARTHROPLASTY (Left) - Left Total Knee Arthroplasty  Patient Location: PACU  Anesthesia Type:GA combined with regional for post-op pain  Level of Consciousness: awake and oriented  Airway & Oxygen Therapy: Patient Spontanous Breathing and Patient connected to nasal cannula oxygen  Post-op Assessment: Report given to PACU RN, Post -op Vital signs reviewed and stable and Patient moving all extremities  Post vital signs: Reviewed and stable  Complications: No apparent anesthesia complications

## 2013-05-11 NOTE — Progress Notes (Signed)
Call to Dr. Sampson Goon to ask about patient not taking bidil this am and bp 134/73 hr 56.  He stated to hold it for now and he would let the anesthesiologist for his case know and they would take care of it in OR.

## 2013-05-11 NOTE — Op Note (Signed)
PATIENT ID:      EDGAR REISZ  MRN:     [id number] DOB/AGE:    [birth year] / 72 y.o.       OPERATIVE REPORT    DATE OF PROCEDURE:  05/11/2013       PREOPERATIVE DIAGNOSIS:   Left Knee Osteoarthritis-end stage                                                       Estimated body mass index is 45.32 kg/(m^2) as calculated from the following:   Height as of 05/04/13: 5\' 8"  (1.727 m).   Weight as of 04/07/13: 135.172 kg (298 lb).     POSTOPERATIVE DIAGNOSIS:   Left Knee Osteoarthritis -same                                                                    Estimated body mass index is 45.32 kg/(m^2) as calculated from the following:   Height as of 05/04/13: 5\' 8"  (1.727 m).   Weight as of 04/07/13: 135.172 kg (298 lb).     PROCEDURE:  Procedure(s): TOTAL KNEE ARTHROPLASTY left     SURGEON:  [name], MD    ASSISTANT:   Jacqualine Code, PA-C   (Present and scrubbed throughout the case, critical for assistance with exposure, retraction, instrumentation, and closure.)          ANESTHESIA: regional and general     DRAINS: (left knee) Hemovact drain(s) in the open with  Suction Open :      TOURNIQUET TIME: * Missing tourniquet times found for documented tourniquets in log:  40981 *    COMPLICATIONS:  None   CONDITION:  stable  PROCEDURE IN DETAIL: 191478   WHITFIELD, PETER W 05/11/2013, 9:26 AM

## 2013-05-11 NOTE — Progress Notes (Signed)
ekg and CXR cancelled due to patient having them within one year.

## 2013-05-11 NOTE — Anesthesia Postprocedure Evaluation (Signed)
Anesthesia Post Note  Patient: [name] [name]  Procedure(s) Performed: Procedure(s) (LRB): TOTAL KNEE ARTHROPLASTY (Left)  Anesthesia type: General  Patient location: PACU  Post pain: Pain level controlled  Post assessment: Patient's Cardiovascular Status Stable  Last Vitals:  Filed Vitals:   05/11/13 1100  BP:   Pulse:   Temp: 36.9 C  Resp:     Post vital signs: Reviewed and stable  Level of consciousness: alert  Complications: No apparent anesthesia complications

## 2013-05-11 NOTE — Preoperative (Signed)
Beta Blockers   Reason not to administer Beta Blockers:Metoprolol taken at 1800 hrs on 05/10/13

## 2013-05-11 NOTE — Progress Notes (Signed)
Orthopedic Tech Progress Note Patient Details:  JOANTHAN HLAVACEK [birth year] [id number]  CPM Left Knee CPM Left Knee: On Left Knee Flexion (Degrees): 60 Left Knee Extension (Degrees): 0 Additional Comments: Trapeze bar and foot roll    Shawnie Pons 05/11/2013, 12:21 PM

## 2013-05-11 NOTE — Anesthesia Procedure Notes (Addendum)
Anesthesia Regional Block:  Femoral nerve block  Pre-Anesthetic Checklist: ,, timeout performed, Correct Patient, Correct Site, Correct Laterality, Correct Procedure, Correct Position, site marked, Risks and benefits discussed,  Surgical consent,  Pre-op evaluation,  At surgeon's request and post-op pain management  Laterality: Left  Prep: chloraprep       Needles:   Needle Type: Other     Needle Length: 9cm  Needle Gauge: 21    Additional Needles:  Procedures: ultrasound guided (picture in chart) Femoral nerve block Narrative:  Start time: 05/11/2013 7:01 AM End time: 05/11/2013 7:09 AM Injection made incrementally with aspirations every 5 mL.  Performed by: Personally  Anesthesiologist: C. Frederick MD  Additional Notes: Ultrasound guidance used to: id relevant anatomy, confirm needle position, local anesthetic spread, avoidance of vascular puncture. Picture saved. No complications. Block performed personally by Janetta Hora. Gelene Mink, MD    Femoral nerve block Procedure Name: Intubation Date/Time: 05/11/2013 7:35 AM Performed by: Charm Barges, Madisin Hasan R Pre-anesthesia Checklist: Patient identified, Emergency Drugs available, Suction available, Patient being monitored and Timeout performed Patient Re-evaluated:Patient Re-evaluated prior to inductionOxygen Delivery Method: Circle system utilized Preoxygenation: Pre-oxygenation with 100% oxygen Intubation Type: IV induction Ventilation: Mask ventilation without difficulty Laryngoscope Size: Mac and 4 Grade View: Grade II Tube type: Oral Tube size: 8.0 mm Number of attempts: 1 Airway Equipment and Method: Stylet Placement Confirmation: ETT inserted through vocal cords under direct vision,  positive ETCO2 and breath sounds checked- equal and bilateral Secured at: 23 cm Tube secured with: Tape Dental Injury: Teeth and Oropharynx as per pre-operative assessment

## 2013-05-11 NOTE — Anesthesia Preprocedure Evaluation (Addendum)
Anesthesia Evaluation  Patient identified by MRN, date of birth, ID band Patient awake    Reviewed: Allergy & Precautions, H&P , NPO status , Patient's Chart, lab work & pertinent test results, reviewed documented beta blocker date and time   Airway Mallampati: II TM Distance: >3 FB Neck ROM: full    Dental   Pulmonary shortness of breath and with exertion, sleep apnea ,  breath sounds clear to auscultation        Cardiovascular hypertension, On Medications and On Home Beta Blockers + CAD and + Peripheral Vascular Disease Rhythm:regular     Neuro/Psych  Neuromuscular disease negative psych ROS   GI/Hepatic Neg liver ROS, GERD-  Medicated and Controlled,  Endo/Other  diabetes, Type 2, Insulin Dependent and Oral Hypoglycemic AgentsHypothyroidism Morbid obesity  Renal/GU negative Renal ROS  negative genitourinary   Musculoskeletal   Abdominal   Peds  Hematology negative hematology ROS (+)   Anesthesia Other Findings See surgeon's H&P   Reproductive/Obstetrics negative OB ROS                           Anesthesia Physical Anesthesia Plan  ASA: III  Anesthesia Plan: General   Post-op Pain Management:    Induction: Intravenous  Airway Management Planned: Oral ETT  Additional Equipment:   Intra-op Plan:   Post-operative Plan: Extubation in OR  Informed Consent: I have reviewed the patients History and Physical, chart, labs and discussed the procedure including the risks, benefits and alternatives for the proposed anesthesia with the patient or authorized representative who has indicated his/her understanding and acceptance.   Dental Advisory Given and Dental advisory given  Plan Discussed with: CRNA, Surgeon and Anesthesiologist  Anesthesia Plan Comments:        Anesthesia Quick Evaluation

## 2013-05-12 ENCOUNTER — Encounter (HOSPITAL_COMMUNITY): Payer: Self-pay | Admitting: Orthopaedic Surgery

## 2013-05-12 LAB — BASIC METABOLIC PANEL
Calcium: 8.6 mg/dL (ref 8.4–10.5)
GFR calc non Af Amer: 67 mL/min — ABNORMAL LOW (ref >90)
Glucose, Bld: 320 mg/dL — ABNORMAL HIGH (ref 70–99)
Potassium: 4.6 mEq/L (ref 3.5–5.1)
Sodium: 133 mEq/L — ABNORMAL LOW (ref 135–145)

## 2013-05-12 LAB — CBC
Hemoglobin: 13.9 g/dL (ref 13.0–17.0)
MCH: 29.1 pg (ref 26.0–34.0)
MCHC: 33.5 g/dL (ref 30.0–36.0)
Platelets: 130 10*3/uL — ABNORMAL LOW (ref 150–400)
RBC: 4.77 MIL/uL (ref 4.22–5.81)

## 2013-05-12 LAB — GLUCOSE, CAPILLARY
Glucose-Capillary: 355 mg/dL — ABNORMAL HIGH (ref 70–99)
Glucose-Capillary: 254 mg/dL — ABNORMAL HIGH (ref 70–99)

## 2013-05-12 MED ORDER — INSULIN GLARGINE 100 UNIT/ML ~~LOC~~ SOLN
35.0000 [IU] | Freq: Every day | SUBCUTANEOUS | Status: DC
  Administered 2013-05-13: 35 [IU] via SUBCUTANEOUS
  Filled 2013-05-12: qty 0.35

## 2013-05-12 MED ORDER — INSULIN ASPART 100 UNIT/ML ~~LOC~~ SOLN
0.0000 [IU] | Freq: Three times a day (TID) | SUBCUTANEOUS | Status: DC
  Administered 2013-05-12: 3 [IU] via SUBCUTANEOUS
  Administered 2013-05-13: 7 [IU] via SUBCUTANEOUS
  Administered 2013-05-13: 4 [IU] via SUBCUTANEOUS

## 2013-05-12 MED ORDER — INSULIN GLARGINE 100 UNIT/ML ~~LOC~~ SOLN
65.0000 [IU] | Freq: Every day | SUBCUTANEOUS | Status: DC
  Filled 2013-05-12 (×2): qty 0.65

## 2013-05-12 NOTE — Op Note (Signed)
[name], [name] NO.:  [id number]  MEDICAL RECORD NO.:  [id number]  LOCATION:  5N21C                        FACILITY:  [facility]  PHYSICIAN:  Claude Manges. Eli Pattillo, M.D.DATE OF BIRTH:  [birth year]  DATE OF PROCEDURE:  05/11/2013 DATE OF DISCHARGE:                              OPERATIVE REPORT   PREOPERATIVE DIAGNOSES: 1. End-stage osteoarthritis, left knee. 2. Morbid obesity with BMI of 46.  POSTOPERATIVE DIAGNOSIS: 1. End-stage osteoarthritis, left knee. 2. Morbid obesity with BMI of 46.  PROCEDURE:  Left total knee replacement.  SURGEON:  Claude Manges. Cleophas Dunker, M.D.  ASSISTANT:  Arlys John D. Petrarca, PAC  ANESTHESIA:  General with supplemental femoral nerve block.  COMPLICATIONS:  None.  COMPONENTS:  DePuy LCS large femoral component #4 rotating keeled tibial tray, 10-mm bridging bearing and metal-backed 3-peg rotating patella. Components were secured with polymethyl methacrylate.  PROCEDURE:  Mr. Hermiz was met in the holding area, identified the left knee as appropriate operative site and answered any questions.  He received the preoperative femoral nerve block by anesthesia.  The patient was then transported to room #7 and placed under general anesthesia without difficulty.  The nursing staff inserted a Foley catheter.  Urine was clear.  Thigh tourniquet was applied to the left thigh.  The left leg was then prepped with Betadine scrub and then DuraPrep from the tourniquet to the tips of the toes.  Sterile draping was performed.  With the extremity still elevated, was Esmarch exsanguinated with a proximal tourniquet at 350 mmHg.  A midline longitudinal incision was made, centered about the patella extending from the superior pouch to tibial tubercle via sharp dissection.  Incision was carried down the subcutaneous tissue.  There was abundant adipose tissue.  This was incised along with the superficial capsule.  Deep capsule was identified and incised  along the length of the skin incision.  The joint was entered.  There was probably 30 mL of clear yellow joint effusion.  There was abundant beefy red synovitis.  The patella was then everted 180 degrees laterally.  The knee flexed to 90 degrees.  Synovectomy was performed.  There were large osteophytes along the medial lateral femoral condyle near complete loss of articular cartilage, both the medial femoral condyle, medial tibial plateau.  There was a fixed varus position of the knee in a flexed flexion contracture.  The medial release was performed subperiosteally with a Cobb elevator.  At that point, I measured a large femoral component.  First, bony cut was made transversely in the proximal tibia using the external tibial guide.  After the bony cut, I checked with the external guide to be sure we had anatomic alignment.  Subsequent cuts were then made on the femur using the large femoral component.  MCL and LCL remained intact throughout the procedure. Laminar spreaders were inserted along the medial lateral compartment to remove the medial lateral menisci, ACL, and PCL, and osteophytes in the posterior femoral condyle medially and laterally.  There was a large Baker cyst identified posterior medially.  This was debrided.  Several small bony osteocartilaginous loose bodies within the posterior capsule, these were removed.  A 4-degree distal femoral valgus  cut was then made followed by the tapered cuts with the final femoral jig.  We measured flexion, extension gaps to be perfectly symmetrical at 10 mm.  Retractors were then placed around the tibia and measured a #4 tibial tray.  This was pinned in place and then checked with the external guide for appropriate rotation.  The center hole was made followed by the keeled cut.  The 10-mm bridging bearing was then applied to the tibial jig followed by the large femoral trial component.  These were reduced. The knee placed with full  range of motion with excellent extension. There was no longer flexure contracture.  There was no opening with varus or valgus stress.  The patella was then prepared by removing 12 mm of patella with leaving thickness of 13 mm.  The 3-holed jig was then inserted.  It was then placed, 3 holes made and trial patella inserted through full range of motion, it remained perfectly stable.  Trial components removed.  The joint was then copiously irrigated with saline solution.  The final components were then impacted with polymethyl methacrylate. We initially inserted the #4 tibial keeled tray followed by the 10-mm bridging bearing and a large femoral component.  The components were impacted in place and then the knee placed in extension.  Extraneous methacrylate was removed from the periphery of the components.  Patella was applied with methacrylate in a bony clamp.  Extraneous methacrylate was removed from its periphery.  While waiting for the methacrylate to mature, we injected the deep capsule with 0.25% Marcaine with epinephrine at approximately 16 minutes.  The methacrylate was matured.  Joint was inspected without evidence of further loose material.  The joint was copiously irrigated with saline solution.  We did change gloves at multiple steps of the procedure.  At 81 minutes, the tourniquet was deflated.  Gross bleeders were Bovie coagulated.  Thrombin spray was placed over the joint surface.  A medium- size Hemovac was placed and exteriorized to the lateral capsule.  We had a nice dry field, but excellent capillary refill to the operative site.  The alignment was appeared to be excellent.  There was no opening with varus or valgus stress.  The deep capsule was then closed with interrupted #1 Ethibond. Superficial capsule closed with running 0 Vicryl, subcu with 3-0 Monocryl.  Skin closed with skin clips.  Sterile bulky dressing was applied followed by the patient's support  stocking.  The patient tolerated the procedure well without complications.     Claude Manges. Cleophas Dunker, M.D.     PWW/MEDQ  D:  05/11/2013  T:  05/12/2013  Job:  [id number]

## 2013-05-12 NOTE — Evaluation (Signed)
Occupational Therapy Evaluation Patient Details Name: [name] [name] MRN: [id number] DOB: 1941/09/05 Today's Date: 05/12/2013 Time: 4098-1191 OT Time Calculation (min): 44 min  OT Assessment / Plan / Recommendation Clinical Impression  Pt is a 72 yr old male admitted for elective left TKA.  Pt currently min assist for functional transfers and min to mod assist for slefcare tasks.  Pt will need a tub bench at home and initial 24 hour supervision for safety.  No post acute OT needs.  Will see again before discharge, expected in the next day or so.    OT Assessment  Patient needs continued OT Services    Follow Up Recommendations  No OT follow up    Barriers to Discharge None    Equipment Recommendations  Tub/shower bench;Other (comment) (needs to support 300 lbs)       Frequency  Min 2X/week    Precautions / Restrictions Precautions Precautions: Knee Restrictions Weight Bearing Restrictions: Yes LLE Weight Bearing: Partial weight bearing LLE Partial Weight Bearing Percentage or Pounds: 50%   Pertinent Vitals/Pain Pain 3/10 in the left knee    ADL  Eating/Feeding: Simulated;Independent Where Assessed - Eating/Feeding: Edge of bed Grooming: Performed;Min guard Where Assessed - Grooming: Supported standing Upper Body Bathing: Simulated;Set up Where Assessed - Upper Body Bathing: Unsupported sitting Lower Body Bathing: Simulated;Minimal assistance Where Assessed - Lower Body Bathing: Supported sit to stand Upper Body Dressing: Simulated;Set up Where Assessed - Upper Body Dressing: Unsupported sitting Lower Body Dressing: Simulated;Moderate assistance Where Assessed - Lower Body Dressing: Supported sit to Pharmacist, hospital: Performed;Minimal assistance Toilet Transfer Method: Other (comment) (ambulated to the bathroom with the RW) Toilet Transfer Equipment: Comfort height toilet;Grab bars Toileting - Clothing Manipulation and Hygiene: Minimal assistance Where Assessed -  Engineer, mining and Hygiene: Sit to stand from 3-in-1 or toilet Tub/Shower Transfer Method: Not assessed Equipment Used: Rolling walker Transfers/Ambulation Related to ADLs: Pt able to ambulate with min assist using the RW. ADL Comments: Pt with decreased ability to reach either foot for dressing tasks.  Discussed AE availability and pt reports that his wife can help with this at home as well.  Discussed need for tub bench since pt has a tub/shower at home.      OT Diagnosis: Generalized weakness;Acute pain  OT Problem List: Decreased strength;Decreased activity tolerance;Impaired balance (sitting and/or standing);Decreased knowledge of use of DME or AE;Pain OT Treatment Interventions: Self-care/ADL training;Therapeutic activities;DME and/or AE instruction;Balance training;Patient/family education   OT Goals Acute Rehab OT Goals OT Goal Formulation: With patient Time For Goal Achievement: 05/19/13 Potential to Achieve Goals: Good ADL Goals Pt Will Perform Grooming: with supervision;Standing at sink;Supported ADL Goal: Grooming - Progress: Goal set today Pt Will Perform Lower Body Dressing: with min assist;with adaptive equipment;Sit to stand from bed ADL Goal: Lower Body Dressing - Progress: Goal set today Pt Will Transfer to Toilet: with supervision;with DME;Ambulation;3-in-1 ADL Goal: Toilet Transfer - Progress: Goal set today Pt Will Perform Tub/Shower Transfer: with min assist;Transfer tub bench;with DME;Ambulation ADL Goal: Tub/Shower Transfer - Progress: Goal set today  Visit Information  Last OT Received On: 05/12/13 Assistance Needed: +1    Subjective Data  Subjective: I want to get back to my woodworking and working in the yard. Patient Stated Goal: Pt wants to be able to get back to his hobbies.   Prior Functioning     Home Living Lives With: Family Available Help at Discharge: Family;Available 24 hours/day Type of Home: House Home Access: Stairs to  enter  Entrance Stairs-Number of Steps: 2 (+1) Entrance Stairs-Rails: Right Home Layout: One level Bathroom Shower/Tub: Tub/shower unit;Curtain Bathroom Toilet: Handicapped height Home Adaptive Equipment: Walker - rolling;Bedside commode/3-in-1;Straight cane Prior Function Level of Independence: Independent Able to Take Stairs?: Yes Driving: Yes Vocation: Retired Comments: enjoys Training and development officer, working in Field seismologist: No difficulties         Vision/Perception Vision - History Baseline Vision: No visual deficits Patient Visual Report: No change from baseline Vision - Assessment Eye Alignment: Within Functional Limits Vision Assessment: Vision not tested Perception Perception: Within Functional Limits Praxis Praxis: Intact   Cognition  Cognition Behavior During Therapy: WFL for tasks assessed/performed Overall Cognitive Status: Within Functional Limits for tasks assessed    Extremity/Trunk Assessment Right Upper Extremity Assessment RUE ROM/Strength/Tone: Within functional levels RUE Sensation: WFL - Light Touch RUE Coordination: WFL - gross/fine motor Left Upper Extremity Assessment LUE ROM/Strength/Tone: Within functional levels LUE Sensation: WFL - Light Touch LUE Coordination: WFL - gross/fine motor Right Lower Extremity Assessment RLE ROM/Strength/Tone: Within functional levels Left Lower Extremity Assessment LLE ROM/Strength/Tone: Deficits;Due to pain LLE ROM/Strength/Tone Deficits: Decr AROM and strength postop Trunk Assessment Trunk Assessment: Normal     Mobility Bed Mobility Bed Mobility: Supine to Sit;Sitting - Scoot to Edge of Bed Supine to Sit: 4: Min assist;With rails Sitting - Scoot to [place] of Bed: 4: Min assist;With rail Details for Bed Mobility Assistance: Cues for technique and physical assist for LLE Transfers Transfers: Sit to Stand Sit to Stand: 4: Min assist;From chair/3-in-1;With upper extremity assist Stand to Sit:  4: Min assist;To toilet Details for Transfer Assistance: Cues for hand placement and safety     Exercise Total Joint Exercises Quad Sets: AROM;Left;10 reps Heel Slides: AAROM;Left;5 reps Goniometric ROM: approx 3-60degrees   Balance Balance Balance Assessed: Yes Static Standing Balance Static Standing - Balance Support: Right upper extremity supported;Left upper extremity supported Static Standing - Level of Assistance: 4: Min assist   End of Session OT - End of Session Activity Tolerance: Patient tolerated treatment well Patient left: in chair;with call bell/phone within reach Nurse Communication: Mobility status CPM Left Knee CPM Left Knee: Off     Ilijah Doucet OTR/L Pager number [phone] 05/12/2013, 1:03 PM

## 2013-05-12 NOTE — Progress Notes (Signed)
Pt already on CPAP at this time, Pt tolerating well, RT to monitor and assess as needed.

## 2013-05-12 NOTE — Progress Notes (Addendum)
Inpatient Diabetes Program Recommendations  AACE/ADA: New Consensus Statement on Inpatient Glycemic Control (2013)  Target Ranges:  Prepandial:   less than 140 mg/dL      Peak postprandial:   less than 180 mg/dL (1-2 hours)      Critically ill patients:  140 - 180 mg/dL   Results for [name] [name], [name] [name] (MRN [id number]) as of 05/12/2013 12:11  Ref. Range 05/11/2013 06:22 05/11/2013 10:01 05/11/2013 13:53 05/11/2013 16:39 05/11/2013 21:59 05/12/2013 06:43  Glucose-Capillary Latest Range: 70-99 mg/dL 784 (H) 696 (H) 295 (H) 231 (H) 276 (H) 355 (H)   Results for [name] [name], [name] [name] (MRN [id number]) as of 05/12/2013 12:11  Ref. Range 05/11/2013 15:07  Hemoglobin A1C Latest Range: <5.7 % 9.0 (H)   Inpatient Diabetes Program Recommendations Insulin - Basal: Please consider increasing Lantus to 35 units QAM and 65 units QHS. Correction:  Please consider increasing Novolog correction to Resistant scale.  Note: Blood glucose over the past 24 hours has ranged from 146-355 mg/dl and fasting blood glucose was 355 mg/dl this morning.  A1C results of 9.0% indicate poor diabetes control.  Please consider increasing Lantus to 35 units QAM and 65 units QHS and increase Novolog correction to resistant scale to improve glycemic control.  Will continue to follow.  Thanks, Orlando Penner, RN, BSN, CCRN Diabetes Coordinator Inpatient Diabetes Program [phone]   05/12/13 @ 13:50 - Discussed glycemic control with [name], RN and she will talk with MD about diabetes coordinator recommendations.  Thanks,  Orlando Penner, RN, BSN, CCRN.

## 2013-05-12 NOTE — Care Management Note (Signed)
CARE MANAGEMENT NOTE 05/12/2013  Patient:  [name] [name], [name] [name]   Account Number:  [id number]  Date Initiated:  05/11/2013  Documentation initiated by:  Vance Peper  Subjective/Objective Assessment:   72 yr old male s/p left total knee arthroplasty.     Action/Plan:   CM spoke with patient and his sister concerning home health and DME needs at discharge. Choice offered. Patient preoperatively setup with [facility], no changes. Rolling walker, 3in1 and CPM are at the home.   Anticipated DC Date:  05/13/2013   Anticipated DC Plan:  HOME W HOME HEALTH SERVICES      DC Planning Services  CM consult      [facility] Choice  HOME HEALTH   Choice offered to / List presented to:  C-1 Patient        HH arranged  HH-2 PT      [facility] agency  [facility]   Status of service:  Completed, signed off Medicare Important Message given?   (If response is "NO", the following Medicare IM given date fields will be blank) Date Medicare IM given:   Date Additional Medicare IM given:    Discharge Disposition:  HOME W HOME HEALTH SERVICES  Per UR Regulation:    If discussed at Long Length of Stay Meetings, dates discussed:    Comments:

## 2013-05-12 NOTE — Progress Notes (Signed)
Physical Therapy Note   05/12/13 1600  PT Visit Information  Last PT Received On 05/12/13  Assistance Needed +1  PT Time Calculation  PT Start Time 1416  PT Stop Time 1459  PT Time Calculation (min) 43 min  Subjective Data  Subjective Agreeable to getting up  Patient Stated Goal walk without pain  Precautions  Precautions Knee  Restrictions  LLE Weight Bearing PWB  LLE Partial Weight Bearing Percentage or Pounds 50%  Cognition  Arousal/Alertness Awake/alert  Behavior During Therapy WFL for tasks assessed/performed  Overall Cognitive Status Within Functional Limits for tasks assessed  Bed Mobility  Bed Mobility Supine to Sit;Sitting - Scoot to Edge of Bed;Sit to Supine  Supine to Sit 4: Min guard;With rails  Sitting - Scoot to Edge of Bed 4: Min guard;With rail  Sit to Supine 4: Min guard;With rail  Details for Bed Mobility Assistance Cues for technique and physical assist for LLE  Transfers  Transfers Sit to Stand;Stand to Sit  Sit to Stand 4: Min guard;From bed  Stand to Sit 4: Min guard;To bed  Details for Transfer Assistance Cues for hand placement and safety  Ambulation/Gait  Ambulation/Gait Assistance 4: Min guard  Ambulation Distance (Feet) 50 Feet  Assistive device Rolling walker  Ambulation/Gait Assistance Details Cues for sequence; managing 50%PWB well  Gait Pattern Step-to pattern  Exercises  Exercises Total Joint  Total Joint Exercises  Quad Sets AROM;Left;10 reps  Heel Slides AAROM;Left;10 reps  Ankle Circles/Pumps AROM;Both;10 reps  Short Arc Quad AAROM;AROM;Left;10 reps  Straight Leg Raises AAROM;AROM;Left;10 reps  PT - End of Session  Equipment Utilized During Treatment Gait belt  Activity Tolerance Patient tolerated treatment well  Patient left in bed;in CPM;with call bell/phone within reach;with family/visitor present  Nurse Communication Mobility status  PT - Assessment/Plan  Comments on Treatment Session Making excellent progress; Very good  straight leg raise, showing improving knee control  PT Plan Discharge plan remains appropriate  PT Frequency 7X/week  Follow Up Recommendations Home health PT;Supervision/Assistance - 24 hour  Acute Rehab PT Goals  Time For Goal Achievement 05/19/13  Potential to Achieve Goals Good  Pt will go Supine/Side to Sit with modified independence  PT Goal: Supine/Side to Sit - Progress Progressing toward goal  Pt will go Sit to Supine/Side with modified independence  PT Goal: Sit to Supine/Side - Progress Progressing toward goal  Pt will go Sit to Stand with modified independence  PT Goal: Sit to Stand - Progress Progressing toward goal  Pt will go Stand to Sit with modified independence  PT Goal: Stand to Sit - Progress Progressing toward goal  Pt will Ambulate >150 feet;with modified independence;with rolling walker  PT Goal: Ambulate - Progress Progressing toward goal  Pt will Perform Home Exercise Program Independently  PT Goal: Perform Home Exercise Program - Progress Goal set today  PT General Charges  $$ ACUTE PT VISIT 1 Procedure  PT Treatments  $Gait Training 8-22 mins  $Therapeutic Exercise 8-22 mins  $Therapeutic Activity 8-22 mins   [place], [place] [phone]

## 2013-05-12 NOTE — Progress Notes (Signed)
Physical Therapy Evaluation Patient Details Name: [name] SWALLOWS MRN: [id number] DOB: [birth year] Today's Date: 05/12/2013 Time: 4098-1191 PT Time Calculation (min): 37 min  PT Assessment / Plan / Recommendation Clinical Impression  72 yo male admitted for L TKA presents with decr fucntional mobility; Will benefit from PT to maximize insependence and safety with mobiilty, and enabl esafe dc home    PT Assessment  Patient needs continued PT services    Follow Up Recommendations  Home health PT;Supervision/Assistance - 24 hour    Does the patient have the potential to tolerate intense rehabilitation      Barriers to Discharge        Equipment Recommendations   (3in 1)    Recommendations for Other Services     Frequency 7X/week    Precautions / Restrictions Precautions Precautions: Knee Restrictions Weight Bearing Restrictions: Yes LLE Weight Bearing: Partial weight bearing LLE Partial Weight Bearing Percentage or Pounds: 50%   Pertinent Vitals/Pain 4/10 pain L knee; stated moving around made it feel better      Mobility  Bed Mobility Bed Mobility: Supine to Sit;Sitting - Scoot to Edge of Bed Supine to Sit: 4: Min assist;With rails Sitting - Scoot to [place] of Bed: 4: Min assist;With rail Details for Bed Mobility Assistance: Cues for technique and physical assist for LLE Transfers Transfers: Sit to Stand;Stand to Sit Sit to Stand: 4: Min assist;From bed Stand to Sit: 4: Min assist;To chair/3-in-1;With armrests Details for Transfer Assistance: Cues for hand placement and safety Ambulation/Gait Ambulation/Gait Assistance: 4: Min assist Ambulation Distance (Feet): 18 Feet Assistive device: Rolling walker Ambulation/Gait Assistance Details: Verbal and demo cues to maintain 50% PWB; cues also for gait sequence Gait Pattern: Step-to pattern    Exercises Total Joint Exercises Quad Sets: AROM;Left;10 reps Heel Slides: AAROM;Left;5 reps Goniometric ROM: approx 3-60degrees    PT Diagnosis: Difficulty walking;Acute pain  PT Problem List: Decreased strength;Decreased range of motion;Decreased activity tolerance;Decreased balance;Decreased mobility;Decreased knowledge of use of DME;Decreased knowledge of precautions;Pain PT Treatment Interventions: DME instruction;Gait training;Stair training;Functional mobility training;Therapeutic activities;Therapeutic exercise;Patient/family education   PT Goals Acute Rehab PT Goals PT Goal Formulation: With patient Time For Goal Achievement: 05/19/13 Potential to Achieve Goals: Good Pt will go Supine/Side to Sit: with modified independence PT Goal: Supine/Side to Sit - Progress: Goal set today Pt will go Sit to Supine/Side: with modified independence PT Goal: Sit to Supine/Side - Progress: Goal set today Pt will go Sit to Stand: with modified independence PT Goal: Sit to Stand - Progress: Goal set today Pt will go Stand to Sit: with modified independence PT Goal: Stand to Sit - Progress: Goal set today Pt will Ambulate: >150 feet;with modified independence;with rolling walker PT Goal: Ambulate - Progress: Goal set today Pt will Go Up / Down Stairs: 1-2 stairs;with modified independence;with rolling walker PT Goal: Up/Down Stairs - Progress: Goal set today Pt will Perform Home Exercise Program: Independently PT Goal: Perform Home Exercise Program - Progress: Goal set today  Visit Information  Last PT Received On: 05/12/13 Assistance Needed: +1    Subjective Data  Subjective: Agreeable to getting up Patient Stated Goal: walk without pain   Prior Functioning  Home Living Lives With: Family Available Help at Discharge: Family;Available 24 hours/day Type of Home: House Home Access: Stairs to enter [employer] of Steps: 2 (+1) Entrance Stairs-Rails: Right Home Layout: One level Bathroom Shower/Tub: Tub/shower unit;Curtain Bathroom Toilet: Handicapped height Home Adaptive Equipment: Walker -  rolling;Bedside commode/3-in-1;Straight cane Prior Function Level of Independence:  Independent Able to Take Stairs?: Yes Driving: Yes Vocation: Retired Comments: enjoys Training and development officer, working in Field seismologist: No difficulties    Research officer, political party During Therapy: WFL for tasks assessed/performed Overall Cognitive Status: Within Functional Limits for tasks assessed    Extremity/Trunk Assessment Right Upper Extremity Assessment RUE ROM/Strength/Tone: Within functional levels Left Upper Extremity Assessment LUE ROM/Strength/Tone: Within functional levels Right Lower Extremity Assessment RLE ROM/Strength/Tone: Within functional levels Left Lower Extremity Assessment LLE ROM/Strength/Tone: Deficits;Due to pain LLE ROM/Strength/Tone Deficits: Decr AROM and strength postop   Balance    End of Session PT - End of Session Equipment Utilized During Treatment: Gait belt Activity Tolerance: Patient tolerated treatment well Patient left: in chair;with call bell/phone within reach Nurse Communication: Mobility status CPM Left Knee CPM Left Knee: Off  GP     Van Clines Kimball, [place] [phone]  05/12/2013, 11:05 AM

## 2013-05-12 NOTE — Progress Notes (Signed)
Patient ID: [name] [name], male   DOB: [birth year], 72 y.o.   MRN: [id number] PATIENT ID: [name] [name]        MRN:  [id number]          DOB/AGE: 1941/02/18 / 72 y.o.    [name] Campbell, MD   Jacqualine Code, PA-C [address] [place], [place]  [address]                             [phone]   PROGRESS NOTE  Subjective:  negative for Chest Pain  negative for Shortness of Breath  negative for Nausea/Vomiting   negative for Calf Pain    Tolerating Diet: yes         Patient reports pain as moderate.     Slept well after CPAP machine applied  Objective: Vital signs in last 24 hours:   Patient Vitals for the past 24 hrs:  BP Temp Temp src Pulse Resp SpO2 Height Weight  05/12/13 0540 141/57 mmHg 98.6 F (37 C) Oral 69 18 97 % - -  05/11/13 2125 153/54 mmHg 98.3 F (36.8 C) Oral 65 18 98 % - -  05/11/13 1538 - - - - 16 - - -  05/11/13 1300 - - - - - - 5' 8.9" (1.75 m) 136.5 kg (300 lb 14.9 oz)  05/11/13 1200 - - - - 16 - - -  05/11/13 1130 166/72 mmHg 98.3 F (36.8 C) Oral 85 16 98 % - -  05/11/13 1100 - 98.4 F (36.9 C) - - - - - -  05/11/13 1045 150/59 mmHg - - 84 15 98 % - -  05/11/13 1030 153/58 mmHg - - 72 14 95 % - -  05/11/13 1015 141/47 mmHg - - 82 17 94 % - -  05/11/13 1000 129/54 mmHg - - 78 15 93 % - -  05/11/13 0945 147/52 mmHg 97.9 F (36.6 C) - 73 16 94 % - -      Intake/Output from previous day:   05/13 0701 - 05/14 0700 In: 3480 [P.O.:480; I.V.:2650] Out: 1400 [Urine:750; Drains:600]   Intake/Output this shift:       Intake/Output     05/13 0701 - 05/14 0700 05/14 0701 - 05/15 0700   P.O. 480    I.V. (mL/kg) 2650 (19.4)    Other 150    IV Piggyback 200    Total Intake(mL/kg) 3480 (25.5)    Urine (mL/kg/hr) 750 (0.2)    Drains 600 (0.2)    Blood 50 (0)    Total Output 1400     Net +2080             LABORATORY DATA:  Recent Labs  05/12/13 0605  WBC 8.3  HGB 13.9  HCT 41.5  PLT 130*    Recent Labs  05/12/13 0605  NA 133*  K 4.6    CL 101  CO2 25  BUN 16  CREATININE 1.08  GLUCOSE 320*  CALCIUM 8.6   Lab Results  Component Value Date   INR 1.00 05/04/2013   INR 1.00 03/04/2013   INR 1.2 01/23/2008    Recent Radiographic Studies :  No results found.   Examination:  General appearance: alert, cooperative and no distress  Wound Exam: clean, dry, intact   Drainage:  None: wound tissue dry  Motor Exam: EHL, FHL, Anterior Tibial and Posterior Tibial Intact  Sensory Exam: Superficial Peroneal, Deep Peroneal  and Tibial normal  Vascular Exam: Normal  Assessment:    1 Day Post-Op  Procedure(s) (LRB): TOTAL KNEE ARTHROPLASTY (Left)  ADDITIONAL DIAGNOSIS:  Active Problems:   * No active hospital problems. *  Hyponatremia-monitor fluid intake   Plan: Physical Therapy as ordered Partial Weight Bearing @ 50% (PWB)  DVT Prophylaxis:  Xarelto  DISCHARGE PLAN: Home  DISCHARGE NEEDS: HHPT   OOB with PT, keep hemovac until am with 250cc's, saline lock IV      Valeria Batman 05/12/2013 7:47 AM

## 2013-05-13 DIAGNOSIS — E871 Hypo-osmolality and hyponatremia: Secondary | ICD-10-CM | POA: Diagnosis not present

## 2013-05-13 DIAGNOSIS — M1712 Unilateral primary osteoarthritis, left knee: Secondary | ICD-10-CM | POA: Diagnosis present

## 2013-05-13 LAB — BASIC METABOLIC PANEL
BUN: 16 mg/dL (ref 6–23)
CO2: 26 mEq/L (ref 19–32)
Calcium: 8.4 mg/dL (ref 8.4–10.5)
Creatinine, Ser: 1.13 mg/dL (ref 0.50–1.35)
GFR calc non Af Amer: 64 mL/min — ABNORMAL LOW (ref >90)
Glucose, Bld: 271 mg/dL — ABNORMAL HIGH (ref 70–99)

## 2013-05-13 LAB — CBC
HCT: 36.8 % — ABNORMAL LOW (ref 39.0–52.0)
Hemoglobin: 12.3 g/dL — ABNORMAL LOW (ref 13.0–17.0)
MCH: 29 pg (ref 26.0–34.0)
MCHC: 33.4 g/dL (ref 30.0–36.0)
MCV: 86.8 fL (ref 78.0–100.0)
RBC: 4.24 MIL/uL (ref 4.22–5.81)

## 2013-05-13 LAB — GLUCOSE, CAPILLARY: Glucose-Capillary: 160 mg/dL — ABNORMAL HIGH (ref 70–99)

## 2013-05-13 MED ORDER — METHOCARBAMOL 500 MG PO TABS: 500.0000 mg | ORAL_TABLET | Freq: Three times a day (TID) | ORAL | Status: DC | PRN

## 2013-05-13 MED ORDER — OXYCODONE HCL 5 MG PO TABS: 5.0000 mg | ORAL_TABLET | ORAL | Status: DC | PRN

## 2013-05-13 MED ORDER — RIVAROXABAN 10 MG PO TABS: 10.0000 mg | ORAL_TABLET | ORAL | Status: DC
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
## 2013-05-13 NOTE — Progress Notes (Signed)
Inpatient Diabetes Program Recommendations  AACE/ADA: New Consensus Statement on Inpatient Glycemic Control (2013)  Target Ranges:  Prepandial:   less than 140 mg/dL      Peak postprandial:   less than 180 mg/dL (1-2 hours)      Critically ill patients:  140 - 180 mg/dL   Results for ASUNCION, SHIBATA (MRN [id number]) as of 05/13/2013 12:54  Ref. Range 05/12/2013 06:43 05/12/2013 11:17 05/12/2013 16:32 05/12/2013 21:46 05/13/2013 07:00 05/13/2013 11:22  Glucose-Capillary Latest Range: 70-99 mg/dL 409 (H) 811 (H) 914 (H) 95 224 (H) 160 (H)    Note: Note that bedtime blood glucose on 5/14 was 95 mg/dl and bedtime Lantus 65 units QHS was charted as "Not given, refused by patient/family".  Fasting blood glucose this morning was 224 mg/dl at 7:82 am.  NURSING: Please encourage patient to take insulin as ordered to improve inpatient glycemic control.  If patient uses blood glucose results to determine if insulin administration would be given at home, please document in chart and make physician aware if insulin is being refused based on certain parameters.  Will continue to follow and make recommendations as needed.  Thanks, Orlando Penner, RN, BSN, CCRN Diabetes Coordinator Inpatient Diabetes Program [phone]

## 2013-05-13 NOTE — Progress Notes (Signed)
Physical Therapy Treatment Patient Details Name: [name] [name] MRN: [id number] DOB: November 17, 1941 Today's Date: 05/13/2013 Time: 4098-1191 PT Time Calculation (min): 30 min  PT Assessment / Plan / Recommendation Comments on Treatment Session  continues to progress with ambulation and ther ex, ROM    Follow Up Recommendations  Home health PT;Supervision/Assistance - 24 hour     Does the patient have the potential to tolerate intense rehabilitation     Barriers to Discharge        Equipment Recommendations  None recommended by PT    Recommendations for Other Services    Frequency 7X/week   Plan Discharge plan remains appropriate    Precautions / Restrictions Precautions Precautions: Knee Restrictions Weight Bearing Restrictions: Yes LLE Weight Bearing: Partial weight bearing LLE Partial Weight Bearing Percentage or Pounds: 50%   Pertinent Vitals/Pain See vitals tab     Mobility  Bed Mobility Bed Mobility: Not assessed (seated in recliner) Supine to Sit: 5: Supervision;HOB elevated;With rails Sitting - Scoot to Edge of Bed: 5: Supervision Transfers Transfers: Sit to Stand;Stand to Sit Sit to Stand: 5: Supervision;From chair/3-in-1;With armrests Stand to Sit: 5: Supervision;With upper extremity assist;To chair/3-in-1 Details for Transfer Assistance: Cues for hand placement and safety Ambulation/Gait Ambulation/Gait Assistance: 5: Supervision Ambulation Distance (Feet): 70 Feet Assistive device: Rolling walker Ambulation/Gait Assistance Details: able to maintain 50% PWB consistently; cues for erect posture Gait Pattern: Step-through pattern;Trunk flexed Gait velocity: decreased Stairs: No    Exercises Total Joint Exercises Ankle Circles/Pumps: AROM;Both;10 reps Quad Sets: AROM;Left;10 reps Long Arc Quad: AROM;AAROM;Left;10 reps;Seated Knee Flexion: AROM;AAROM;Left;5 reps;Seated Goniometric ROM: 0-65   PT Diagnosis:    PT Problem List:   PT Treatment  Interventions:     PT Goals Acute Rehab PT Goals Time For Goal Achievement: 05/19/13 Pt will go Sit to Stand: with modified independence PT Goal: Sit to Stand - Progress: Progressing toward goal Pt will go Stand to Sit: with modified independence PT Goal: Stand to Sit - Progress: Progressing toward goal Pt will Ambulate: >150 feet;with modified independence;with rolling walker PT Goal: Ambulate - Progress: Progressing toward goal Pt will Perform Home Exercise Program: Independently PT Goal: Perform Home Exercise Program - Progress: Progressing toward goal  Visit Information  Last PT Received On: 05/13/13 Assistance Needed: +1    Subjective Data  Subjective: Pt. says doctor is letting him go this pm Patient Stated Goal: walk without pain   Cognition  Cognition Arousal/Alertness: Awake/alert Behavior During Therapy: WFL for tasks assessed/performed Overall Cognitive Status: Within Functional Limits for tasks assessed    Balance  Balance Balance Assessed: Yes Static Standing Balance Static Standing - Balance Support: Right upper extremity supported;Left upper extremity supported Static Standing - Level of Assistance: 5: Stand by assistance  End of Session PT - End of Session Equipment Utilized During Treatment: Gait belt Activity Tolerance: Patient tolerated treatment well Patient left: in chair;with call bell/phone within reach Nurse Communication: Mobility status   GP     Ferman Hamming 05/13/2013, 12:22 PM Weldon Picking PT Acute Rehab Services [phone] [place] [phone]

## 2013-05-13 NOTE — Progress Notes (Signed)
Patient ID: [name] [name], male   DOB: Sep 13, 1941, 72 y.o.   MRN: [id number] PATIENT ID: [name] [name]        MRN:  [id number]          DOB/AGE: 04-07-1941 / 72 y.o.    [name] Campbell, MD   Jacqualine Code, PA-C [address] [place], [place]  [address]                             [phone]   PROGRESS NOTE  Subjective:  negative for Chest Pain  negative for Shortness of Breath  negative for Nausea/Vomiting   negative for Calf Pain    Tolerating Diet: yes         Patient reports pain as mild and moderate.     Wants to go home  Objective: Vital signs in last 24 hours:   Patient Vitals for the past 24 hrs:  BP Temp Temp src Pulse Resp SpO2  05/13/13 0617 117/59 mmHg 99.5 F (37.5 C) Oral 70 18 94 %  05/12/13 2020 134/69 mmHg 99 F (37.2 C) Oral 77 18 94 %  05/12/13 1710 133/58 mmHg - - 90 - -  05/12/13 1600 - - - - 18 96 %  05/12/13 1430 112/54 mmHg 98.1 F (36.7 C) - 89 18 96 %  05/12/13 1252 - - - - - 95 %  05/12/13 1200 - - - - 16 95 %      Intake/Output from previous day:   05/14 0701 - 05/15 0700 In: 480 [P.O.:480] Out: 1150 [Urine:1050; Drains:100]   Intake/Output this shift:       Intake/Output     05/14 0701 - 05/15 0700 05/15 0701 - 05/16 0700   P.O. 480    I.V. (mL/kg)     Other     IV Piggyback     Total Intake(mL/kg) 480 (3.5)    Urine (mL/kg/hr) 1050 (0.3)    Drains 100 (0)    Blood     Total Output 1150     Net -670          Urine Occurrence 3 x       LABORATORY DATA:  Recent Labs  05/12/13 0605 05/13/13 0510  WBC 8.3 8.8  HGB 13.9 12.3*  HCT 41.5 36.8*  PLT 130* 138*    Recent Labs  05/12/13 0605 05/13/13 0510  NA 133* 134*  K 4.6 3.9  CL 101 98  CO2 25 26  BUN 16 16  CREATININE 1.08 1.13  GLUCOSE 320* 271*  CALCIUM 8.6 8.4   Lab Results  Component Value Date   INR 1.00 05/04/2013   INR 1.00 03/04/2013   INR 1.2 01/23/2008    Recent Radiographic Studies :  No results found.   Examination:  General appearance:  alert, cooperative and mild distress  Wound Exam: clean, dry, intact   Drainage:  None: wound tissue dry  Motor Exam: EHL, FHL, Anterior Tibial and Posterior Tibial Intact  Sensory Exam: Superficial Peroneal, Deep Peroneal and Tibial normal  Vascular Exam: Left dorsalis pedis artery has trace pulse  Assessment:    2 Days Post-Op  Procedure(s) (LRB): TOTAL KNEE ARTHROPLASTY (Left)  ADDITIONAL DIAGNOSIS:  Active Problems:   * No active hospital problems. *  Acute Blood Loss Anemia asymptomatic, Hyponatremia, Diabetes and Sleep Apnea   Plan: Physical Therapy as ordered Partial Weight Bearing @ 50% (PWB)  DVT Prophylaxis:  Xarelto  DISCHARGE PLAN: Home  DISCHARGE NEEDS: HHPT, CPM, Walker and 3-in-1 comode seat  Home today        [facility] 05/13/2013 10:55 AM

## 2013-05-13 NOTE — Progress Notes (Signed)
Physical Therapy Treatment Patient Details Name: [name] [name] MRN: [id number] DOB: 1941/01/12 Today's Date: 05/13/2013 Time: 1478-2956 PT Time Calculation (min): 18 min  PT Assessment / Plan / Recommendation Comments on Treatment Session  Stair traning complete. Patient to DC home    Follow Up Recommendations  Home health PT;Supervision/Assistance - 24 hour     Does the patient have the potential to tolerate intense rehabilitation     Barriers to Discharge        Equipment Recommendations  None recommended by PT    Recommendations for Other Services    Frequency 7X/week   Plan Discharge plan remains appropriate    Precautions / Restrictions Precautions Precautions: Knee Restrictions Weight Bearing Restrictions: Yes LLE Weight Bearing: Partial weight bearing LLE Partial Weight Bearing Percentage or Pounds: 50%   Pertinent Vitals/Pain no apparent distress     Mobility  Bed Mobility Bed Mobility: Not assessed (seated in recliner) Supine to Sit: 5: Supervision;HOB elevated;With rails Sitting - Scoot to Edge of Bed: 5: Supervision Transfers Transfers: Sit to Stand;Stand to Sit Sit to Stand: 5: Supervision;From chair/3-in-1;With armrests Stand to Sit: 5: Supervision;With upper extremity assist;To chair/3-in-1 Details for Transfer Assistance: Cues for hand placement and safety Ambulation/Gait Ambulation/Gait Assistance: 5: Supervision Ambulation Distance (Feet): 20 Feet Assistive device: Rolling walker Ambulation/Gait Assistance Details: able to maintain 50% PWB consistently; cues for erect posture Gait Pattern: Step-through pattern;Trunk flexed Gait velocity: decreased Stairs: Yes Stairs Assistance: 4: Min assist Stairs Assistance Details (indicate cue type and reason): Cues for sequency and technique. A for RW. Handout given Stair Management Technique: Step to pattern;Backwards;With walker Number of Stairs: 2    Exercises Total Joint Exercises Ankle  Circles/Pumps: AROM;Both;10 reps Quad Sets: AROM;Left;10 reps Long Arc Quad: AROM;AAROM;Left;10 reps;Seated Knee Flexion: AROM;AAROM;Left;5 reps;Seated Goniometric ROM: 0-65   PT Diagnosis:    PT Problem List:   PT Treatment Interventions:     PT Goals Acute Rehab PT Goals Time For Goal Achievement: 05/19/13 Pt will go Sit to Stand: with modified independence PT Goal: Sit to Stand - Progress: Progressing toward goal Pt will go Stand to Sit: with modified independence PT Goal: Stand to Sit - Progress: Progressing toward goal Pt will Ambulate: >150 feet;with modified independence;with rolling walker PT Goal: Ambulate - Progress: Progressing toward goal PT Goal: Up/Down Stairs - Progress: Progressing toward goal Pt will Perform Home Exercise Program: Independently PT Goal: Perform Home Exercise Program - Progress: Progressing toward goal  Visit Information  Last PT Received On: 05/13/13 Assistance Needed: +1    Subjective Data  Subjective: Pt. says doctor is letting him go this pm Patient Stated Goal: walk without pain   Cognition  Cognition Arousal/Alertness: Awake/alert Behavior During Therapy: WFL for tasks assessed/performed Overall Cognitive Status: Within Functional Limits for tasks assessed    Balance  Balance Balance Assessed: Yes Static Standing Balance Static Standing - Balance Support: Right upper extremity supported;Left upper extremity supported Static Standing - Level of Assistance: 5: Stand by assistance  End of Session PT - End of Session Equipment Utilized During Treatment: Gait belt Activity Tolerance: Patient tolerated treatment well Patient left: in chair;with call bell/phone within reach Nurse Communication: Mobility status   GP     Fredrich Birks 05/13/2013, 1:55 PM 05/13/2013 Fredrich Birks PTA [phone] pager [phone] office

## 2013-05-13 NOTE — Progress Notes (Signed)
Occupational Therapy Treatment Patient Details Name: TYRANN DONAHO MRN: [id number] DOB: 03-Aug-1941 Today's Date: 05/13/2013 Time: 0827-0920 OT Time Calculation (min): 53 min  OT Assessment / Plan / Recommendation Comments on Treatment Session Pt making steady progress with OT at this time.  Overall supervision level for toileting and min assist for tub shower transfers.  Will have assistance from wife at discharge.  Will need heavy duty tub bench capabale of supporting 200 lbs.    Follow Up Recommendations  No OT follow up       Equipment Recommendations  Tub/shower bench;Other (comment) (Needs to support 300 lbs)       Frequency Min 2X/week   Plan Discharge plan remains appropriate    Precautions / Restrictions Precautions Precautions: Knee Restrictions LLE Weight Bearing: Partial weight bearing LLE Partial Weight Bearing Percentage or Pounds: 50%   Pertinent Vitals/Pain Pain 2 on the faces scale, pt repositioned    ADL  Grooming: Performed;Supervision/safety Where Assessed - Grooming: Supported standing Toilet Transfer: Supervision/safety Statistician Method: Other (comment) (ambulate with assistive device) Acupuncturist: Materials engineer and Hygiene: Performed;Supervision/safety Where Assessed - Engineer, mining and Hygiene: Sit to stand from 3-in-1 or toilet Tub/Shower Transfer: Performed Tub/Shower Transfer Method: Ambulating (min assist) Psychologist, educational: Counsellor Used: Rolling walker Transfers/Ambulation Related to ADLs: Pt overall supervison level for mobility with use of RW. ADL Comments: Pt is min assist for lifting the left leg into the tub over the edge.      OT Goals ADL Goals ADL Goal: Grooming - Progress: Met ADL Goal: Toilet Transfer - Progress: Met ADL Goal: Tub/Shower Transfer - Progress: Met  Visit Information  Last OT Received On: 05/13/13 Assistance  Needed: +1          Cognition  Cognition Arousal/Alertness: Awake/alert Behavior During Therapy: WFL for tasks assessed/performed Overall Cognitive Status: Within Functional Limits for tasks assessed    Mobility  Bed Mobility Bed Mobility: Supine to Sit;Sitting - Scoot to Edge of Bed Supine to Sit: 5: Supervision;HOB elevated;With rails Sitting - Scoot to Edge of Bed: 5: Supervision Transfers Transfers: Sit to Stand Sit to Stand: 5: Supervision;From elevated surface;With upper extremity assist Stand to Sit: 5: Supervision;With upper extremity assist       Balance Balance Balance Assessed: Yes Static Standing Balance Static Standing - Balance Support: Right upper extremity supported;Left upper extremity supported Static Standing - Level of Assistance: 5: Stand by assistance   End of Session OT - End of Session Equipment Utilized During Treatment: Gait belt Patient left: in chair;with call bell/phone within reach Nurse Communication: Mobility status     Rhina Kramme OTR/L Pager number [phone] 05/13/2013, 10:54 AM

## 2013-05-14 DIAGNOSIS — Z471 Aftercare following joint replacement surgery: Secondary | ICD-10-CM | POA: Diagnosis not present

## 2013-05-14 DIAGNOSIS — Z96659 Presence of unspecified artificial knee joint: Secondary | ICD-10-CM | POA: Diagnosis not present

## 2013-05-14 DIAGNOSIS — I10 Essential (primary) hypertension: Secondary | ICD-10-CM | POA: Diagnosis not present

## 2013-05-14 DIAGNOSIS — IMO0001 Reserved for inherently not codable concepts without codable children: Secondary | ICD-10-CM | POA: Diagnosis not present

## 2013-05-14 DIAGNOSIS — E119 Type 2 diabetes mellitus without complications: Secondary | ICD-10-CM | POA: Diagnosis not present

## 2013-05-17 DIAGNOSIS — I10 Essential (primary) hypertension: Secondary | ICD-10-CM | POA: Diagnosis not present

## 2013-05-17 DIAGNOSIS — IMO0001 Reserved for inherently not codable concepts without codable children: Secondary | ICD-10-CM | POA: Diagnosis not present

## 2013-05-17 DIAGNOSIS — E119 Type 2 diabetes mellitus without complications: Secondary | ICD-10-CM | POA: Diagnosis not present

## 2013-05-17 DIAGNOSIS — Z96659 Presence of unspecified artificial knee joint: Secondary | ICD-10-CM | POA: Diagnosis not present

## 2013-05-17 DIAGNOSIS — Z471 Aftercare following joint replacement surgery: Secondary | ICD-10-CM | POA: Diagnosis not present

## 2013-05-18 DIAGNOSIS — Z471 Aftercare following joint replacement surgery: Secondary | ICD-10-CM | POA: Diagnosis not present

## 2013-05-18 DIAGNOSIS — I10 Essential (primary) hypertension: Secondary | ICD-10-CM | POA: Diagnosis not present

## 2013-05-18 DIAGNOSIS — E119 Type 2 diabetes mellitus without complications: Secondary | ICD-10-CM | POA: Diagnosis not present

## 2013-05-18 DIAGNOSIS — Z96659 Presence of unspecified artificial knee joint: Secondary | ICD-10-CM | POA: Diagnosis not present

## 2013-05-18 DIAGNOSIS — IMO0001 Reserved for inherently not codable concepts without codable children: Secondary | ICD-10-CM | POA: Diagnosis not present

## 2013-05-19 DIAGNOSIS — Z96659 Presence of unspecified artificial knee joint: Secondary | ICD-10-CM | POA: Diagnosis not present

## 2013-05-19 DIAGNOSIS — E119 Type 2 diabetes mellitus without complications: Secondary | ICD-10-CM | POA: Diagnosis not present

## 2013-05-19 DIAGNOSIS — I10 Essential (primary) hypertension: Secondary | ICD-10-CM | POA: Diagnosis not present

## 2013-05-19 DIAGNOSIS — Z471 Aftercare following joint replacement surgery: Secondary | ICD-10-CM | POA: Diagnosis not present

## 2013-05-19 DIAGNOSIS — IMO0001 Reserved for inherently not codable concepts without codable children: Secondary | ICD-10-CM | POA: Diagnosis not present

## 2013-05-20 DIAGNOSIS — I10 Essential (primary) hypertension: Secondary | ICD-10-CM | POA: Diagnosis not present

## 2013-05-20 DIAGNOSIS — E119 Type 2 diabetes mellitus without complications: Secondary | ICD-10-CM | POA: Diagnosis not present

## 2013-05-20 DIAGNOSIS — IMO0001 Reserved for inherently not codable concepts without codable children: Secondary | ICD-10-CM | POA: Diagnosis not present

## 2013-05-20 DIAGNOSIS — Z96659 Presence of unspecified artificial knee joint: Secondary | ICD-10-CM | POA: Diagnosis not present

## 2013-05-20 DIAGNOSIS — Z471 Aftercare following joint replacement surgery: Secondary | ICD-10-CM | POA: Diagnosis not present

## 2013-05-22 DIAGNOSIS — Z96659 Presence of unspecified artificial knee joint: Secondary | ICD-10-CM | POA: Diagnosis not present

## 2013-05-22 DIAGNOSIS — I10 Essential (primary) hypertension: Secondary | ICD-10-CM | POA: Diagnosis not present

## 2013-05-22 DIAGNOSIS — E119 Type 2 diabetes mellitus without complications: Secondary | ICD-10-CM | POA: Diagnosis not present

## 2013-05-22 DIAGNOSIS — Z471 Aftercare following joint replacement surgery: Secondary | ICD-10-CM | POA: Diagnosis not present

## 2013-05-22 DIAGNOSIS — IMO0001 Reserved for inherently not codable concepts without codable children: Secondary | ICD-10-CM | POA: Diagnosis not present

## 2013-05-23 DIAGNOSIS — Z96659 Presence of unspecified artificial knee joint: Secondary | ICD-10-CM | POA: Diagnosis not present

## 2013-05-23 DIAGNOSIS — Z471 Aftercare following joint replacement surgery: Secondary | ICD-10-CM | POA: Diagnosis not present

## 2013-05-23 DIAGNOSIS — IMO0001 Reserved for inherently not codable concepts without codable children: Secondary | ICD-10-CM | POA: Diagnosis not present

## 2013-05-23 DIAGNOSIS — I10 Essential (primary) hypertension: Secondary | ICD-10-CM | POA: Diagnosis not present

## 2013-05-23 DIAGNOSIS — E119 Type 2 diabetes mellitus without complications: Secondary | ICD-10-CM | POA: Diagnosis not present

## 2013-05-24 NOTE — Progress Notes (Signed)
PT is recommending home with HH and not SNF.  Clinical Social Worker will sign off for now as social work intervention is no longer needed. Please consult us again if new need arises.   Shareen Capwell, MSW [phone] 

## 2013-05-25 DIAGNOSIS — I10 Essential (primary) hypertension: Secondary | ICD-10-CM | POA: Diagnosis not present

## 2013-05-25 DIAGNOSIS — Z471 Aftercare following joint replacement surgery: Secondary | ICD-10-CM | POA: Diagnosis not present

## 2013-05-25 DIAGNOSIS — Z96659 Presence of unspecified artificial knee joint: Secondary | ICD-10-CM | POA: Diagnosis not present

## 2013-05-25 DIAGNOSIS — E119 Type 2 diabetes mellitus without complications: Secondary | ICD-10-CM | POA: Diagnosis not present

## 2013-05-25 DIAGNOSIS — IMO0001 Reserved for inherently not codable concepts without codable children: Secondary | ICD-10-CM | POA: Diagnosis not present

## 2013-05-26 DIAGNOSIS — IMO0001 Reserved for inherently not codable concepts without codable children: Secondary | ICD-10-CM | POA: Diagnosis not present

## 2013-05-26 DIAGNOSIS — Z96659 Presence of unspecified artificial knee joint: Secondary | ICD-10-CM | POA: Diagnosis not present

## 2013-05-26 DIAGNOSIS — M25569 Pain in unspecified knee: Secondary | ICD-10-CM | POA: Diagnosis not present

## 2013-05-26 DIAGNOSIS — Z471 Aftercare following joint replacement surgery: Secondary | ICD-10-CM | POA: Diagnosis not present

## 2013-05-26 DIAGNOSIS — I10 Essential (primary) hypertension: Secondary | ICD-10-CM | POA: Diagnosis not present

## 2013-05-26 DIAGNOSIS — E119 Type 2 diabetes mellitus without complications: Secondary | ICD-10-CM | POA: Diagnosis not present

## 2013-05-27 DIAGNOSIS — Z96659 Presence of unspecified artificial knee joint: Secondary | ICD-10-CM | POA: Diagnosis not present

## 2013-05-27 DIAGNOSIS — I10 Essential (primary) hypertension: Secondary | ICD-10-CM | POA: Diagnosis not present

## 2013-05-27 DIAGNOSIS — IMO0001 Reserved for inherently not codable concepts without codable children: Secondary | ICD-10-CM | POA: Diagnosis not present

## 2013-05-27 DIAGNOSIS — Z471 Aftercare following joint replacement surgery: Secondary | ICD-10-CM | POA: Diagnosis not present

## 2013-05-27 DIAGNOSIS — E119 Type 2 diabetes mellitus without complications: Secondary | ICD-10-CM | POA: Diagnosis not present

## 2013-05-28 ENCOUNTER — Other Ambulatory Visit: Payer: Self-pay | Admitting: Emergency Medicine

## 2013-05-28 ENCOUNTER — Other Ambulatory Visit (INDEPENDENT_AMBULATORY_CARE_PROVIDER_SITE_OTHER): Payer: Medicare Other

## 2013-05-28 DIAGNOSIS — R81 Glycosuria: Secondary | ICD-10-CM

## 2013-05-28 DIAGNOSIS — E119 Type 2 diabetes mellitus without complications: Secondary | ICD-10-CM | POA: Diagnosis not present

## 2013-05-28 DIAGNOSIS — I10 Essential (primary) hypertension: Secondary | ICD-10-CM | POA: Diagnosis not present

## 2013-05-28 DIAGNOSIS — Z96659 Presence of unspecified artificial knee joint: Secondary | ICD-10-CM | POA: Diagnosis not present

## 2013-05-28 DIAGNOSIS — Z471 Aftercare following joint replacement surgery: Secondary | ICD-10-CM | POA: Diagnosis not present

## 2013-05-28 DIAGNOSIS — IMO0001 Reserved for inherently not codable concepts without codable children: Secondary | ICD-10-CM | POA: Diagnosis not present

## 2013-05-28 LAB — RENAL FUNCTION PANEL
Albumin: 3.6 g/dL (ref 3.5–5.2)
BUN: 15 mg/dL (ref 6–23)
Calcium: 9.5 mg/dL (ref 8.4–10.5)
Creatinine, Ser: 1 mg/dL (ref 0.4–1.5)
Glucose, Bld: 144 mg/dL — ABNORMAL HIGH (ref 70–99)
Phosphorus: 4.2 mg/dL (ref 2.3–4.6)
Potassium: 4.1 mEq/L (ref 3.5–5.1)

## 2013-05-28 LAB — URINALYSIS
Bilirubin Urine: NEGATIVE
Hgb urine dipstick: NEGATIVE
Ketones, ur: NEGATIVE
Total Protein, Urine: NEGATIVE
Urine Glucose: >=1000
Urobilinogen, UA: 0.2 (ref 0.0–1.0)

## 2013-05-31 DIAGNOSIS — IMO0001 Reserved for inherently not codable concepts without codable children: Secondary | ICD-10-CM | POA: Diagnosis not present

## 2013-05-31 DIAGNOSIS — E119 Type 2 diabetes mellitus without complications: Secondary | ICD-10-CM | POA: Diagnosis not present

## 2013-05-31 DIAGNOSIS — I10 Essential (primary) hypertension: Secondary | ICD-10-CM | POA: Diagnosis not present

## 2013-05-31 DIAGNOSIS — Z471 Aftercare following joint replacement surgery: Secondary | ICD-10-CM | POA: Diagnosis not present

## 2013-05-31 DIAGNOSIS — Z96659 Presence of unspecified artificial knee joint: Secondary | ICD-10-CM | POA: Diagnosis not present

## 2013-06-01 ENCOUNTER — Ambulatory Visit (INDEPENDENT_AMBULATORY_CARE_PROVIDER_SITE_OTHER): Payer: Medicare Other | Admitting: Family

## 2013-06-01 ENCOUNTER — Encounter: Payer: Self-pay | Admitting: Family

## 2013-06-01 VITALS — BP 146/80 | HR 64 | Temp 98.2°F | Resp 16 | Ht 68.0 in | Wt 288.1 lb

## 2013-06-01 DIAGNOSIS — I10 Essential (primary) hypertension: Secondary | ICD-10-CM | POA: Diagnosis not present

## 2013-06-01 DIAGNOSIS — M25569 Pain in unspecified knee: Secondary | ICD-10-CM | POA: Diagnosis not present

## 2013-06-01 DIAGNOSIS — E785 Hyperlipidemia, unspecified: Secondary | ICD-10-CM

## 2013-06-01 DIAGNOSIS — M25562 Pain in left knee: Secondary | ICD-10-CM

## 2013-06-01 DIAGNOSIS — G4733 Obstructive sleep apnea (adult) (pediatric): Secondary | ICD-10-CM

## 2013-06-01 MED ORDER — SITAGLIPTIN PHOSPHATE 100 MG PO TABS: 100.0000 mg | ORAL_TABLET | Freq: Every day | ORAL | Status: DC

## 2013-06-01 NOTE — Assessment & Plan Note (Signed)
S/p TKA, continues PT, management per ortho.

## 2013-06-01 NOTE — Assessment & Plan Note (Signed)
Continue CPAP.  
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
## 2013-06-01 NOTE — Assessment & Plan Note (Signed)
BP Readings from Last 3 Encounters:  06/01/13 146/80  05/13/13 122/54  05/13/13 122/54   BP up slightly today, monitor on bidil, clonidine, amlodipine, altace.

## 2013-06-01 NOTE — Assessment & Plan Note (Signed)
Continue lantus/apidra, add januvia, follow up with Dr. Juleen China.

## 2013-06-01 NOTE — Patient Instructions (Addendum)
Start Januvia, schedule follow up with Dr. Juleen China. Work hard on diabetic diet. Follow up in 3 months.

## 2013-06-01 NOTE — Assessment & Plan Note (Signed)
Lipids at goal last fall.  Tolerating statin, continue same.

## 2013-06-02 DIAGNOSIS — I10 Essential (primary) hypertension: Secondary | ICD-10-CM | POA: Diagnosis not present

## 2013-06-02 DIAGNOSIS — Z96659 Presence of unspecified artificial knee joint: Secondary | ICD-10-CM | POA: Diagnosis not present

## 2013-06-02 DIAGNOSIS — IMO0001 Reserved for inherently not codable concepts without codable children: Secondary | ICD-10-CM | POA: Diagnosis not present

## 2013-06-02 DIAGNOSIS — E119 Type 2 diabetes mellitus without complications: Secondary | ICD-10-CM | POA: Diagnosis not present

## 2013-06-02 DIAGNOSIS — Z471 Aftercare following joint replacement surgery: Secondary | ICD-10-CM | POA: Diagnosis not present

## 2013-06-03 ENCOUNTER — Telehealth: Payer: Self-pay | Admitting: *Deleted

## 2013-06-03 NOTE — Telephone Encounter (Signed)
Message copied by Kathi Simpers on Thu Jun 03, 2013  4:51 PM ------      Message from: O'SULLIVAN, MELISSA      Created: Thu Jun 03, 2013  7:47 AM       Diabetes is uncontrolled. A1C is 10.  I would like him to arrange follow up with Dr. Juleen China and increase his AM lantus from 40 units to 50 units. ------

## 2013-06-03 NOTE — Telephone Encounter (Signed)
Notified pt and he voices understanding. Med list updated.

## 2013-06-04 DIAGNOSIS — Z96659 Presence of unspecified artificial knee joint: Secondary | ICD-10-CM | POA: Diagnosis not present

## 2013-06-04 DIAGNOSIS — I10 Essential (primary) hypertension: Secondary | ICD-10-CM | POA: Diagnosis not present

## 2013-06-04 DIAGNOSIS — E119 Type 2 diabetes mellitus without complications: Secondary | ICD-10-CM | POA: Diagnosis not present

## 2013-06-04 DIAGNOSIS — Z471 Aftercare following joint replacement surgery: Secondary | ICD-10-CM | POA: Diagnosis not present

## 2013-06-04 DIAGNOSIS — IMO0001 Reserved for inherently not codable concepts without codable children: Secondary | ICD-10-CM | POA: Diagnosis not present

## 2013-06-07 ENCOUNTER — Ambulatory Visit: Payer: Medicare Other | Attending: Orthopaedic Surgery | Admitting: Physical Therapy

## 2013-06-07 DIAGNOSIS — R609 Edema, unspecified: Secondary | ICD-10-CM | POA: Diagnosis not present

## 2013-06-07 DIAGNOSIS — R262 Difficulty in walking, not elsewhere classified: Secondary | ICD-10-CM | POA: Diagnosis not present

## 2013-06-07 DIAGNOSIS — Z96649 Presence of unspecified artificial hip joint: Secondary | ICD-10-CM | POA: Diagnosis not present

## 2013-06-07 DIAGNOSIS — M25669 Stiffness of unspecified knee, not elsewhere classified: Secondary | ICD-10-CM | POA: Insufficient documentation

## 2013-06-07 DIAGNOSIS — IMO0001 Reserved for inherently not codable concepts without codable children: Secondary | ICD-10-CM | POA: Insufficient documentation

## 2013-06-07 DIAGNOSIS — M25569 Pain in unspecified knee: Secondary | ICD-10-CM | POA: Insufficient documentation

## 2013-06-08 ENCOUNTER — Telehealth: Payer: Self-pay | Admitting: Family

## 2013-06-08 NOTE — Telephone Encounter (Signed)
OK not to take [place].  I would recommend that he arrange follow up with his endo Dr. Juleen China for further adjustment of his insulin.

## 2013-06-08 NOTE — Telephone Encounter (Signed)
Patient states that he refuses to take [place] because one of the side effects of this medication is pancreatitis. He says that he has had pancreatitis before and it "nearly killed him". He wanted to know if there is anything else he could take?

## 2013-06-10 ENCOUNTER — Ambulatory Visit: Payer: Medicare Other | Admitting: Physical Therapy

## 2013-06-10 DIAGNOSIS — R262 Difficulty in walking, not elsewhere classified: Secondary | ICD-10-CM | POA: Diagnosis not present

## 2013-06-10 DIAGNOSIS — R609 Edema, unspecified: Secondary | ICD-10-CM | POA: Diagnosis not present

## 2013-06-10 DIAGNOSIS — Z96649 Presence of unspecified artificial hip joint: Secondary | ICD-10-CM | POA: Diagnosis not present

## 2013-06-10 DIAGNOSIS — M25569 Pain in unspecified knee: Secondary | ICD-10-CM | POA: Diagnosis not present

## 2013-06-10 DIAGNOSIS — IMO0001 Reserved for inherently not codable concepts without codable children: Secondary | ICD-10-CM | POA: Diagnosis not present

## 2013-06-10 DIAGNOSIS — M25669 Stiffness of unspecified knee, not elsewhere classified: Secondary | ICD-10-CM | POA: Diagnosis not present

## 2013-06-10 NOTE — Telephone Encounter (Signed)
Notified pt and he voices understanding. 

## 2013-06-14 ENCOUNTER — Ambulatory Visit: Payer: Medicare Other | Admitting: Physical Therapy

## 2013-06-14 DIAGNOSIS — R609 Edema, unspecified: Secondary | ICD-10-CM | POA: Diagnosis not present

## 2013-06-14 DIAGNOSIS — IMO0001 Reserved for inherently not codable concepts without codable children: Secondary | ICD-10-CM | POA: Diagnosis not present

## 2013-06-14 DIAGNOSIS — R262 Difficulty in walking, not elsewhere classified: Secondary | ICD-10-CM | POA: Diagnosis not present

## 2013-06-14 DIAGNOSIS — Z96649 Presence of unspecified artificial hip joint: Secondary | ICD-10-CM | POA: Diagnosis not present

## 2013-06-14 DIAGNOSIS — M25669 Stiffness of unspecified knee, not elsewhere classified: Secondary | ICD-10-CM | POA: Diagnosis not present

## 2013-06-14 DIAGNOSIS — M25569 Pain in unspecified knee: Secondary | ICD-10-CM | POA: Diagnosis not present

## 2013-06-16 ENCOUNTER — Ambulatory Visit: Payer: Medicare Other | Admitting: Physical Therapy

## 2013-06-16 DIAGNOSIS — M25569 Pain in unspecified knee: Secondary | ICD-10-CM | POA: Diagnosis not present

## 2013-06-16 DIAGNOSIS — R609 Edema, unspecified: Secondary | ICD-10-CM | POA: Diagnosis not present

## 2013-06-16 DIAGNOSIS — IMO0001 Reserved for inherently not codable concepts without codable children: Secondary | ICD-10-CM | POA: Diagnosis not present

## 2013-06-16 DIAGNOSIS — M25669 Stiffness of unspecified knee, not elsewhere classified: Secondary | ICD-10-CM | POA: Diagnosis not present

## 2013-06-16 DIAGNOSIS — Z96649 Presence of unspecified artificial hip joint: Secondary | ICD-10-CM | POA: Diagnosis not present

## 2013-06-16 DIAGNOSIS — R262 Difficulty in walking, not elsewhere classified: Secondary | ICD-10-CM | POA: Diagnosis not present

## 2013-06-17 ENCOUNTER — Ambulatory Visit: Payer: Medicare Other | Admitting: Physical Therapy

## 2013-06-17 DIAGNOSIS — M25569 Pain in unspecified knee: Secondary | ICD-10-CM | POA: Diagnosis not present

## 2013-06-17 DIAGNOSIS — IMO0001 Reserved for inherently not codable concepts without codable children: Secondary | ICD-10-CM | POA: Diagnosis not present

## 2013-06-17 DIAGNOSIS — Z96649 Presence of unspecified artificial hip joint: Secondary | ICD-10-CM | POA: Diagnosis not present

## 2013-06-17 DIAGNOSIS — R609 Edema, unspecified: Secondary | ICD-10-CM | POA: Diagnosis not present

## 2013-06-17 DIAGNOSIS — R262 Difficulty in walking, not elsewhere classified: Secondary | ICD-10-CM | POA: Diagnosis not present

## 2013-06-17 DIAGNOSIS — M25669 Stiffness of unspecified knee, not elsewhere classified: Secondary | ICD-10-CM | POA: Diagnosis not present

## 2013-06-21 ENCOUNTER — Other Ambulatory Visit: Payer: Self-pay | Admitting: Internal Medicine

## 2013-06-21 ENCOUNTER — Ambulatory Visit: Payer: Medicare Other | Admitting: Physical Therapy

## 2013-06-21 DIAGNOSIS — R262 Difficulty in walking, not elsewhere classified: Secondary | ICD-10-CM | POA: Diagnosis not present

## 2013-06-21 DIAGNOSIS — Z96649 Presence of unspecified artificial hip joint: Secondary | ICD-10-CM | POA: Diagnosis not present

## 2013-06-21 DIAGNOSIS — IMO0001 Reserved for inherently not codable concepts without codable children: Secondary | ICD-10-CM | POA: Diagnosis not present

## 2013-06-21 DIAGNOSIS — M25569 Pain in unspecified knee: Secondary | ICD-10-CM | POA: Diagnosis not present

## 2013-06-21 DIAGNOSIS — M25669 Stiffness of unspecified knee, not elsewhere classified: Secondary | ICD-10-CM | POA: Diagnosis not present

## 2013-06-21 DIAGNOSIS — R609 Edema, unspecified: Secondary | ICD-10-CM | POA: Diagnosis not present

## 2013-06-22 ENCOUNTER — Other Ambulatory Visit: Payer: Self-pay | Admitting: Family

## 2013-06-22 DIAGNOSIS — E119 Type 2 diabetes mellitus without complications: Secondary | ICD-10-CM | POA: Diagnosis not present

## 2013-06-22 DIAGNOSIS — H40019 Open angle with borderline findings, low risk, unspecified eye: Secondary | ICD-10-CM | POA: Diagnosis not present

## 2013-06-22 NOTE — Telephone Encounter (Signed)
Could you please confirm with pt?  To my knowledge he is doing 50 units AM and 50 units PM.

## 2013-06-22 NOTE — Telephone Encounter (Signed)
Please advise re: lantus refill request. Our med list indicates lantus is 50 units in the morning and 60 units in the evening.

## 2013-06-23 ENCOUNTER — Telehealth: Payer: Self-pay | Admitting: Family

## 2013-06-23 ENCOUNTER — Ambulatory Visit: Payer: Medicare Other | Admitting: Physical Therapy

## 2013-06-23 DIAGNOSIS — M25669 Stiffness of unspecified knee, not elsewhere classified: Secondary | ICD-10-CM | POA: Diagnosis not present

## 2013-06-23 DIAGNOSIS — R262 Difficulty in walking, not elsewhere classified: Secondary | ICD-10-CM | POA: Diagnosis not present

## 2013-06-23 DIAGNOSIS — R609 Edema, unspecified: Secondary | ICD-10-CM | POA: Diagnosis not present

## 2013-06-23 DIAGNOSIS — Z96649 Presence of unspecified artificial hip joint: Secondary | ICD-10-CM | POA: Diagnosis not present

## 2013-06-23 DIAGNOSIS — M25569 Pain in unspecified knee: Secondary | ICD-10-CM | POA: Diagnosis not present

## 2013-06-23 DIAGNOSIS — IMO0001 Reserved for inherently not codable concepts without codable children: Secondary | ICD-10-CM | POA: Diagnosis not present

## 2013-06-23 NOTE — Telephone Encounter (Signed)
Spoke with pt. He states he takes 40 units in the morning and 50 units in the evening of Lanuts. Med list updated and refill sent.

## 2013-06-23 NOTE — Telephone Encounter (Signed)
Patients daughter called in saying not to mail patients handicap sticker, that she will pick up card when ready. I told her that we would call her when ready for pick up

## 2013-06-23 NOTE — Telephone Encounter (Signed)
See 06/21/13 refill note.

## 2013-06-23 NOTE — Telephone Encounter (Signed)
Patient called regarding this rx. He says that he is out and needs refill

## 2013-06-24 ENCOUNTER — Ambulatory Visit: Payer: Medicare Other | Admitting: Physical Therapy

## 2013-06-24 DIAGNOSIS — M25669 Stiffness of unspecified knee, not elsewhere classified: Secondary | ICD-10-CM | POA: Diagnosis not present

## 2013-06-24 DIAGNOSIS — IMO0001 Reserved for inherently not codable concepts without codable children: Secondary | ICD-10-CM | POA: Diagnosis not present

## 2013-06-24 DIAGNOSIS — M25569 Pain in unspecified knee: Secondary | ICD-10-CM | POA: Diagnosis not present

## 2013-06-24 DIAGNOSIS — R262 Difficulty in walking, not elsewhere classified: Secondary | ICD-10-CM | POA: Diagnosis not present

## 2013-06-24 DIAGNOSIS — Z96649 Presence of unspecified artificial hip joint: Secondary | ICD-10-CM | POA: Diagnosis not present

## 2013-06-24 DIAGNOSIS — R609 Edema, unspecified: Secondary | ICD-10-CM | POA: Diagnosis not present

## 2013-06-24 NOTE — Telephone Encounter (Signed)
Notified pt's daughter and she will pick up form this afternoon. Form placed at front desk for pick up.

## 2013-06-29 ENCOUNTER — Ambulatory Visit: Payer: Medicare Other | Admitting: Physical Therapy

## 2013-07-01 ENCOUNTER — Ambulatory Visit: Payer: Medicare Other | Attending: Orthopaedic Surgery | Admitting: Rehabilitation

## 2013-07-01 ENCOUNTER — Other Ambulatory Visit: Payer: Self-pay | Admitting: Internal Medicine

## 2013-07-01 DIAGNOSIS — R262 Difficulty in walking, not elsewhere classified: Secondary | ICD-10-CM | POA: Diagnosis not present

## 2013-07-01 DIAGNOSIS — M25569 Pain in unspecified knee: Secondary | ICD-10-CM | POA: Diagnosis not present

## 2013-07-01 DIAGNOSIS — Z96649 Presence of unspecified artificial hip joint: Secondary | ICD-10-CM | POA: Insufficient documentation

## 2013-07-01 DIAGNOSIS — M25669 Stiffness of unspecified knee, not elsewhere classified: Secondary | ICD-10-CM | POA: Diagnosis not present

## 2013-07-01 DIAGNOSIS — R609 Edema, unspecified: Secondary | ICD-10-CM | POA: Insufficient documentation

## 2013-07-01 DIAGNOSIS — IMO0001 Reserved for inherently not codable concepts without codable children: Secondary | ICD-10-CM | POA: Insufficient documentation

## 2013-07-01 NOTE — Telephone Encounter (Signed)
Rx request to pharmacy/[facility]  

## 2013-07-05 ENCOUNTER — Ambulatory Visit: Payer: Medicare Other | Admitting: Physical Therapy

## 2013-07-05 DIAGNOSIS — M25569 Pain in unspecified knee: Secondary | ICD-10-CM | POA: Diagnosis not present

## 2013-07-05 DIAGNOSIS — M25669 Stiffness of unspecified knee, not elsewhere classified: Secondary | ICD-10-CM | POA: Diagnosis not present

## 2013-07-05 DIAGNOSIS — R262 Difficulty in walking, not elsewhere classified: Secondary | ICD-10-CM | POA: Diagnosis not present

## 2013-07-05 DIAGNOSIS — Z96649 Presence of unspecified artificial hip joint: Secondary | ICD-10-CM | POA: Diagnosis not present

## 2013-07-05 DIAGNOSIS — R609 Edema, unspecified: Secondary | ICD-10-CM | POA: Diagnosis not present

## 2013-07-05 DIAGNOSIS — IMO0001 Reserved for inherently not codable concepts without codable children: Secondary | ICD-10-CM | POA: Diagnosis not present

## 2013-07-07 ENCOUNTER — Encounter: Payer: Medicare Other | Admitting: Physical Therapy

## 2013-07-08 ENCOUNTER — Ambulatory Visit: Payer: Medicare Other | Admitting: Physical Therapy

## 2013-07-08 DIAGNOSIS — R609 Edema, unspecified: Secondary | ICD-10-CM | POA: Diagnosis not present

## 2013-07-08 DIAGNOSIS — IMO0001 Reserved for inherently not codable concepts without codable children: Secondary | ICD-10-CM | POA: Diagnosis not present

## 2013-07-08 DIAGNOSIS — M25669 Stiffness of unspecified knee, not elsewhere classified: Secondary | ICD-10-CM | POA: Diagnosis not present

## 2013-07-08 DIAGNOSIS — R262 Difficulty in walking, not elsewhere classified: Secondary | ICD-10-CM | POA: Diagnosis not present

## 2013-07-08 DIAGNOSIS — Z96649 Presence of unspecified artificial hip joint: Secondary | ICD-10-CM | POA: Diagnosis not present

## 2013-07-08 DIAGNOSIS — M25569 Pain in unspecified knee: Secondary | ICD-10-CM | POA: Diagnosis not present

## 2013-07-12 ENCOUNTER — Ambulatory Visit: Payer: Medicare Other | Admitting: Physical Therapy

## 2013-07-12 ENCOUNTER — Encounter: Payer: Medicare Other | Admitting: Physical Therapy

## 2013-07-12 ENCOUNTER — Other Ambulatory Visit: Payer: Self-pay | Admitting: Internal Medicine

## 2013-07-12 DIAGNOSIS — Z96649 Presence of unspecified artificial hip joint: Secondary | ICD-10-CM | POA: Diagnosis not present

## 2013-07-12 DIAGNOSIS — IMO0001 Reserved for inherently not codable concepts without codable children: Secondary | ICD-10-CM | POA: Diagnosis not present

## 2013-07-12 DIAGNOSIS — R262 Difficulty in walking, not elsewhere classified: Secondary | ICD-10-CM | POA: Diagnosis not present

## 2013-07-12 DIAGNOSIS — R609 Edema, unspecified: Secondary | ICD-10-CM | POA: Diagnosis not present

## 2013-07-12 DIAGNOSIS — M25569 Pain in unspecified knee: Secondary | ICD-10-CM | POA: Diagnosis not present

## 2013-07-12 DIAGNOSIS — M25669 Stiffness of unspecified knee, not elsewhere classified: Secondary | ICD-10-CM | POA: Diagnosis not present

## 2013-07-12 NOTE — Telephone Encounter (Signed)
Rx request to pharmacy/[facility]  

## 2013-07-15 ENCOUNTER — Ambulatory Visit: Payer: Medicare Other | Admitting: Physical Therapy

## 2013-07-15 DIAGNOSIS — IMO0001 Reserved for inherently not codable concepts without codable children: Secondary | ICD-10-CM | POA: Diagnosis not present

## 2013-07-15 DIAGNOSIS — R609 Edema, unspecified: Secondary | ICD-10-CM | POA: Diagnosis not present

## 2013-07-15 DIAGNOSIS — M25669 Stiffness of unspecified knee, not elsewhere classified: Secondary | ICD-10-CM | POA: Diagnosis not present

## 2013-07-15 DIAGNOSIS — R262 Difficulty in walking, not elsewhere classified: Secondary | ICD-10-CM | POA: Diagnosis not present

## 2013-07-15 DIAGNOSIS — M25569 Pain in unspecified knee: Secondary | ICD-10-CM | POA: Diagnosis not present

## 2013-07-15 DIAGNOSIS — Z96649 Presence of unspecified artificial hip joint: Secondary | ICD-10-CM | POA: Diagnosis not present

## 2013-07-19 ENCOUNTER — Ambulatory Visit: Payer: Medicare Other | Admitting: Physical Therapy

## 2013-07-22 ENCOUNTER — Ambulatory Visit: Payer: Medicare Other | Admitting: Physical Therapy

## 2013-08-02 ENCOUNTER — Encounter: Payer: Self-pay | Admitting: Gastroenterology

## 2013-08-24 ENCOUNTER — Telehealth: Payer: Self-pay | Admitting: Family

## 2013-08-24 NOTE — Telephone Encounter (Signed)
Patient is requesting a refill of metformin and test strips to be sent to [facility] on [address]

## 2013-08-25 ENCOUNTER — Other Ambulatory Visit: Payer: Self-pay | Admitting: *Deleted

## 2013-08-25 ENCOUNTER — Other Ambulatory Visit: Payer: Self-pay | Admitting: Family

## 2013-08-25 MED ORDER — METFORMIN HCL ER 500 MG PO TB24: ORAL_TABLET | ORAL | Status: DC

## 2013-08-25 MED ORDER — GLUCOSE BLOOD VI STRP: ORAL_STRIP | Status: DC

## 2013-08-25 NOTE — Telephone Encounter (Signed)
Rx request to pharmacy/[facility]  

## 2013-08-25 NOTE — Telephone Encounter (Signed)
Refill sent, left message on home phone.

## 2013-08-31 ENCOUNTER — Ambulatory Visit: Payer: Medicare Other | Admitting: Family

## 2013-09-06 DIAGNOSIS — M1710 Unilateral primary osteoarthritis, unspecified knee: Secondary | ICD-10-CM | POA: Diagnosis not present

## 2013-09-06 DIAGNOSIS — Z96659 Presence of unspecified artificial knee joint: Secondary | ICD-10-CM | POA: Diagnosis not present

## 2013-09-06 DIAGNOSIS — M25569 Pain in unspecified knee: Secondary | ICD-10-CM | POA: Diagnosis not present

## 2013-10-21 ENCOUNTER — Other Ambulatory Visit: Payer: Self-pay

## 2013-10-21 MED ORDER — TAMSULOSIN HCL 0.4 MG PO CAPS: 0.4000 mg | ORAL_CAPSULE | Freq: Every day | ORAL | Status: DC

## 2013-11-01 ENCOUNTER — Telehealth: Payer: Self-pay | Admitting: Family

## 2013-11-01 MED ORDER — INSULIN GLULISINE 100 UNIT/ML IJ SOLN: INTRAMUSCULAR | Status: DC

## 2013-11-01 NOTE — Telephone Encounter (Signed)
Patient states that he needs a refill of apidra sent to [facility] on [address]. He says that he is out of this med.

## 2013-11-01 NOTE — Telephone Encounter (Signed)
Refill sent to pharmacy. Pt was due for follow up in September and is past due. Pt will need to be seen before further refills can be given. Notified pt that 1 vial has been sent to his pharmacy and he will need to be seen before we can give further refills. Pt states he is currently speaking with his insurance agent and will call back tomorrow to arrange appt.

## 2013-11-03 ENCOUNTER — Telehealth: Payer: Self-pay | Admitting: Family

## 2013-11-03 NOTE — Telephone Encounter (Signed)
Left detailed message for patient to call the office to schedule new patient appointment with Malva Cogan, PA-C

## 2013-11-03 NOTE — Telephone Encounter (Signed)
Patient would like to transfer from Sandford Craze, FNP to [employer], PA-C. Is this okay? Thanks!

## 2013-11-03 NOTE — Telephone Encounter (Signed)
That's fine

## 2013-11-03 NOTE — Telephone Encounter (Signed)
OK with me.

## 2013-11-04 ENCOUNTER — Other Ambulatory Visit: Payer: Self-pay

## 2013-11-04 ENCOUNTER — Ambulatory Visit (INDEPENDENT_AMBULATORY_CARE_PROVIDER_SITE_OTHER): Payer: Medicare Other | Admitting: Physician Assistant

## 2013-11-04 ENCOUNTER — Encounter: Payer: Self-pay | Admitting: Physician Assistant

## 2013-11-04 VITALS — BP 154/62 | HR 84 | Temp 97.7°F | Resp 20 | Ht 68.0 in | Wt 306.5 lb

## 2013-11-04 DIAGNOSIS — R6889 Other general symptoms and signs: Secondary | ICD-10-CM | POA: Diagnosis not present

## 2013-11-04 DIAGNOSIS — E785 Hyperlipidemia, unspecified: Secondary | ICD-10-CM

## 2013-11-04 DIAGNOSIS — E119 Type 2 diabetes mellitus without complications: Secondary | ICD-10-CM | POA: Diagnosis not present

## 2013-11-04 DIAGNOSIS — I25810 Atherosclerosis of coronary artery bypass graft(s) without angina pectoris: Secondary | ICD-10-CM | POA: Diagnosis not present

## 2013-11-04 DIAGNOSIS — N401 Enlarged prostate with lower urinary tract symptoms: Secondary | ICD-10-CM

## 2013-11-04 DIAGNOSIS — I10 Essential (primary) hypertension: Secondary | ICD-10-CM

## 2013-11-04 DIAGNOSIS — E039 Hypothyroidism, unspecified: Secondary | ICD-10-CM

## 2013-11-04 DIAGNOSIS — Z23 Encounter for immunization: Secondary | ICD-10-CM

## 2013-11-04 DIAGNOSIS — R899 Unspecified abnormal finding in specimens from other organs, systems and tissues: Secondary | ICD-10-CM

## 2013-11-04 LAB — COMPREHENSIVE METABOLIC PANEL
Albumin: 3.7 g/dL (ref 3.5–5.2)
CO2: 23 mEq/L (ref 19–32)
Calcium: 9.4 mg/dL (ref 8.4–10.5)
Chloride: 106 mEq/L (ref 96–112)
Glucose, Bld: 119 mg/dL — ABNORMAL HIGH (ref 70–99)
Potassium: 4.5 mEq/L (ref 3.5–5.3)
Sodium: 137 mEq/L (ref 135–145)
Total Protein: 6.5 g/dL (ref 6.0–8.3)

## 2013-11-04 LAB — LIPID PANEL
Cholesterol: 161 mg/dL (ref 0–200)
LDL Cholesterol: 89 mg/dL (ref 0–99)
Total CHOL/HDL Ratio: 5.2 Ratio
Triglycerides: 205 mg/dL — ABNORMAL HIGH (ref <150)

## 2013-11-04 LAB — CBC WITH DIFFERENTIAL/PLATELET
Hemoglobin: 14.9 g/dL (ref 13.0–17.0)
Lymphs Abs: 2.2 10*3/uL (ref 0.7–4.0)
MCHC: 34.6 g/dL (ref 30.0–36.0)
Monocytes Relative: 9 % (ref 3–12)
Neutro Abs: 3.5 10*3/uL (ref 1.7–7.7)
Neutrophils Relative %: 52 % (ref 43–77)
RBC: 4.93 MIL/uL (ref 4.22–5.81)
RDW: 15 % (ref 11.5–15.5)

## 2013-11-04 LAB — HEMOGLOBIN A1C: Mean Plasma Glucose: 255 mg/dL — ABNORMAL HIGH (ref <117)

## 2013-11-04 MED ORDER — METFORMIN HCL ER 500 MG PO TB24: ORAL_TABLET | ORAL | Status: DC

## 2013-11-04 MED ORDER — INSULIN GLULISINE 100 UNIT/ML IJ SOLN: INTRAMUSCULAR | Status: DC

## 2013-11-04 NOTE — Patient Instructions (Signed)
Please obtain labs.  I will call you with your results.   Please schedule appointment with your endocrinologist, who will tailor your diabetic medications.  We will tailor your other medications, pending your lab results.
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
## 2013-11-05 ENCOUNTER — Telehealth: Payer: Self-pay | Admitting: Physician Assistant

## 2013-11-05 DIAGNOSIS — E119 Type 2 diabetes mellitus without complications: Secondary | ICD-10-CM

## 2013-11-05 LAB — URINALYSIS, ROUTINE W REFLEX MICROSCOPIC
Hgb urine dipstick: NEGATIVE
Leukocytes, UA: NEGATIVE
Nitrite: NEGATIVE
Specific Gravity, Urine: 1.02 (ref 1.005–1.030)
pH: 5 (ref 5.0–8.0)

## 2013-11-05 LAB — MICROALBUMIN / CREATININE URINE RATIO: Microalb Creat Ratio: 10 mg/g (ref 0.0–30.0)

## 2013-11-05 MED ORDER — INSULIN GLULISINE 100 UNIT/ML IJ SOLN: INTRAMUSCULAR | Status: DC

## 2013-11-05 NOTE — Telephone Encounter (Signed)
Reorder to pharmacy; pt informed/SLS

## 2013-11-05 NOTE — Telephone Encounter (Signed)
APRIDRIA CALLED IN TO [facility] ON [address]  HE NORMALLY GETS 4 BOTTLES AT A TIME.

## 2013-11-07 NOTE — Assessment & Plan Note (Signed)
Diabetic foot exam performed and within normal limits.  Will obtain A1C, UA and Urine microalbumin.  Patient to follow-up with Endocrinologist for medication changes.

## 2013-11-07 NOTE — Assessment & Plan Note (Addendum)
Symptoms controlled on Flomax

## 2013-11-07 NOTE — Assessment & Plan Note (Signed)
Will obtain fasting lipid panel.  Continue current regimen for now.

## 2013-11-07 NOTE — Assessment & Plan Note (Signed)
Continue current regimen.  Will recheck BP at follow-up.

## 2013-11-07 NOTE — Assessment & Plan Note (Signed)
Will obtain TFTs.

## 2013-11-08 ENCOUNTER — Telehealth: Payer: Self-pay

## 2013-11-08 NOTE — Telephone Encounter (Signed)
Message copied by Eulis Manly on Mon Nov 08, 2013  1:07 PM ------      Message from: Marcelline Mates      Created: Fri Nov 05, 2013  7:21 AM       Please inform patient that overall his labs look good.  A1C has increased from 5 months ago -- now at 10.5 .  It is extremely important that he follow-up with his Endocrinologist as discussed at visit yesterday. ------

## 2013-11-08 NOTE — Telephone Encounter (Signed)
Patient informed of lab results and instructed to follow up with his Endocrinologist due to his A1C being elevated.

## 2013-11-24 ENCOUNTER — Ambulatory Visit (INDEPENDENT_AMBULATORY_CARE_PROVIDER_SITE_OTHER): Payer: Medicare Other | Admitting: Physician Assistant

## 2013-11-24 ENCOUNTER — Encounter: Payer: Self-pay | Admitting: Physician Assistant

## 2013-11-24 ENCOUNTER — Ambulatory Visit (HOSPITAL_BASED_OUTPATIENT_CLINIC_OR_DEPARTMENT_OTHER)
Admission: RE | Admit: 2013-11-24 | Discharge: 2013-11-24 | Disposition: A | Discharge status: Home / Self Care | Payer: Medicare Other | Source: Ambulatory Visit | Attending: Physician Assistant | Admitting: Physician Assistant

## 2013-11-24 VITALS — BP 140/78 | HR 68 | Temp 98.0°F | Resp 16 | Ht 68.0 in | Wt 307.0 lb

## 2013-11-24 DIAGNOSIS — L0291 Cutaneous abscess, unspecified: Secondary | ICD-10-CM

## 2013-11-24 DIAGNOSIS — M7989 Other specified soft tissue disorders: Secondary | ICD-10-CM | POA: Diagnosis not present

## 2013-11-24 DIAGNOSIS — M79609 Pain in unspecified limb: Secondary | ICD-10-CM | POA: Insufficient documentation

## 2013-11-24 DIAGNOSIS — L0390 Cellulitis, unspecified: Secondary | ICD-10-CM

## 2013-11-24 MED ORDER — DOXYCYCLINE HYCLATE 100 MG PO TABS: 100.0000 mg | ORAL_TABLET | Freq: Two times a day (BID) | ORAL | Status: DC
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
## 2013-11-24 NOTE — Progress Notes (Signed)
Pre visit review using our clinic review tool, if applicable. No additional management support is needed unless otherwise documented below in the visit note. 

## 2013-11-24 NOTE — Assessment & Plan Note (Signed)
Rx doxycycline.  Elevate leg.  Wear compression stockings.  Moisturize lower extremities.  Continue medication for edema.  Venous doppler of LLE due to edema with R >> L to rule out DVT.

## 2013-11-24 NOTE — Patient Instructions (Signed)
Please take antibiotic as prescribed until all tablets are gone.  Elevate legs at home.  Wear compression stockings.  Please go downstairs for ultrasound.  I will call you with your results.  If you develop chest pain or shortness of breath, please proceed to the ER.

## 2013-12-14 ENCOUNTER — Other Ambulatory Visit: Payer: Self-pay | Admitting: Family

## 2013-12-15 ENCOUNTER — Telehealth: Payer: Self-pay | Admitting: Physician Assistant

## 2013-12-15 NOTE — Telephone Encounter (Signed)
Received form from [facility] for RA Truetest glucose test strip, forward to nurse

## 2013-12-16 ENCOUNTER — Other Ambulatory Visit: Payer: Self-pay | Admitting: *Deleted

## 2013-12-16 MED ORDER — GLUCOSE BLOOD VI STRP: ORAL_STRIP | Status: DC

## 2013-12-16 NOTE — Telephone Encounter (Signed)
Faxed Rx for test strips to pharmacy/SLS

## 2013-12-16 NOTE — Progress Notes (Signed)
Received fax from pharmacy, done/SLS

## 2013-12-20 DIAGNOSIS — E119 Type 2 diabetes mellitus without complications: Secondary | ICD-10-CM | POA: Diagnosis not present

## 2013-12-20 DIAGNOSIS — H2510 Age-related nuclear cataract, unspecified eye: Secondary | ICD-10-CM | POA: Diagnosis not present

## 2013-12-23 ENCOUNTER — Other Ambulatory Visit: Payer: Self-pay | Admitting: Internal Medicine

## 2014-01-05 ENCOUNTER — Telehealth: Payer: Self-pay | Admitting: Family

## 2014-01-05 MED ORDER — METOPROLOL SUCCINATE ER 50 MG PO TB24: ORAL_TABLET | ORAL | Status: DC

## 2014-01-05 MED ORDER — AMLODIPINE BESYLATE 10 MG PO TABS: ORAL_TABLET | ORAL | Status: DC

## 2014-01-05 NOTE — Telephone Encounter (Signed)
Patient is requesting refills of amlodipine and metoprolol to be sent to [facility]

## 2014-01-05 NOTE — Telephone Encounter (Signed)
Refills sent

## 2014-01-28 ENCOUNTER — Other Ambulatory Visit: Payer: Self-pay | Admitting: Family

## 2014-01-28 NOTE — Telephone Encounter (Signed)
Rx request to pharmacy/[facility]  

## 2014-02-14 ENCOUNTER — Other Ambulatory Visit: Payer: Self-pay | Admitting: Physician Assistant

## 2014-02-14 NOTE — Telephone Encounter (Signed)
Refill sent per [facility] refill protocol/SLS  

## 2014-03-07 DIAGNOSIS — Z96659 Presence of unspecified artificial knee joint: Secondary | ICD-10-CM | POA: Diagnosis not present

## 2014-03-14 ENCOUNTER — Other Ambulatory Visit: Payer: Self-pay | Admitting: *Deleted

## 2014-03-14 DIAGNOSIS — E119 Type 2 diabetes mellitus without complications: Secondary | ICD-10-CM

## 2014-03-14 MED ORDER — INSULIN GLULISINE 100 UNIT/ML IJ SOLN: INTRAMUSCULAR | Status: DC

## 2014-03-14 NOTE — Progress Notes (Signed)
Per pharmacy request for 30-day supply/SLS

## 2014-04-10 ENCOUNTER — Other Ambulatory Visit: Payer: Self-pay | Admitting: Family

## 2014-04-20 ENCOUNTER — Other Ambulatory Visit: Payer: Self-pay | Admitting: Family

## 2014-04-20 NOTE — Telephone Encounter (Signed)
Rx request to pharmacy; 30-day supply as patient was instructed to receive Diabetes medication from Endocrinology in November 2014 & has not had OV since 11.2014, as well/SLS

## 2014-05-04 ENCOUNTER — Other Ambulatory Visit: Payer: Self-pay | Admitting: Family

## 2014-05-04 NOTE — Telephone Encounter (Signed)
Patient called in regarding this stating that he has been requesting this for three weeks now. He says that he is very upset over this and would like this this afternoon. I did explain to him that we just received the refill request from the pharmacy this afternoon.

## 2014-05-04 NOTE — Telephone Encounter (Signed)
Refill was last sent 04/11/14, #100 x 2 refills. Spoke with [facility] and they say they did not receive Rx. Gave verbal to [place]. Left detailed message on pt's home # re: completion and to call if any questions.

## 2014-05-12 ENCOUNTER — Telehealth: Payer: Self-pay | Admitting: Physician Assistant

## 2014-05-12 MED ORDER — GLUCOSE BLOOD VI STRP: ORAL_STRIP | Status: DC

## 2014-05-12 NOTE — Telephone Encounter (Signed)
Patient walked in stating that the pharmacy has not received the truetest test strips refill. This happened last month. Please call pharmacy. Patient also dropped off form from the pharmacy and his daughter, Lenna Sciara will pick up tomorrow at her appointment. Form placed in [facility] box.

## 2014-05-12 NOTE — Telephone Encounter (Signed)
Form forwarded to provider; Rx to pharmacy/SLS

## 2014-05-16 ENCOUNTER — Other Ambulatory Visit: Payer: Self-pay | Admitting: Physician Assistant

## 2014-05-16 NOTE — Telephone Encounter (Signed)
Rx request to pharmacy/[facility]  

## 2014-05-27 ENCOUNTER — Other Ambulatory Visit: Payer: Self-pay | Admitting: Physician Assistant

## 2014-05-27 NOTE — Telephone Encounter (Signed)
Rx request to pharmacy; [facility] with contact name and number [for return call, if needed] RE: non-compliance for not abiding by provider instructions given to set up appointment with Endocrinology for A&E and future maintenance of Diabetes care. Pt informed that we would not refuse him his medication, but instead, he could be dismissed from provider's care and the practice's care for non-compliance, if he does not follow through with directions given by provider/SLS

## 2014-05-27 NOTE — Telephone Encounter (Signed)
Medication Detail      Disp Refills Start End     insulin glargine (LANTUS) 100 UNIT/ML injection 30 mL 0 04/20/2014     Sig: INJECT INTO THE SKIN TWO TIMES DAILY. Inject 50 Units Subcutaneously Every Morning and 40 Units Subcutaneously At Bedtime    Notes to Pharmacy: PLEASE NOTE CHANGE OF DIRECTIONS; 30-DAY SUPPLY AS PATIENT IS TO BEING GETTING DIABETIC MEDICATIONS FROM ENDOCRINOLOGY AND NEEDS APPOINTMENT    E-Prescribing Status: Receipt confirmed by pharmacy (04/20/2014 11:57 AM EDT)    Pharmacy    [facility] [place], [place]

## 2014-06-20 ENCOUNTER — Telehealth: Payer: Self-pay | Admitting: Physician Assistant

## 2014-06-20 DIAGNOSIS — E1165 Type 2 diabetes mellitus with hyperglycemia: Principal | ICD-10-CM

## 2014-06-20 DIAGNOSIS — IMO0001 Reserved for inherently not codable concepts without codable children: Secondary | ICD-10-CM

## 2014-06-20 NOTE — Telephone Encounter (Signed)
Patient wanted to inform [name] [name] that he has an appointment with the endocrinologist on 07/28/14

## 2014-06-23 DIAGNOSIS — E291 Testicular hypofunction: Secondary | ICD-10-CM | POA: Diagnosis not present

## 2014-06-23 DIAGNOSIS — E119 Type 2 diabetes mellitus without complications: Secondary | ICD-10-CM | POA: Diagnosis not present

## 2014-06-23 DIAGNOSIS — E789 Disorder of lipoprotein metabolism, unspecified: Secondary | ICD-10-CM | POA: Diagnosis not present

## 2014-06-25 ENCOUNTER — Other Ambulatory Visit: Payer: Self-pay | Admitting: Physician Assistant

## 2014-06-25 DIAGNOSIS — E669 Obesity, unspecified: Principal | ICD-10-CM

## 2014-06-25 DIAGNOSIS — E1169 Type 2 diabetes mellitus with other specified complication: Secondary | ICD-10-CM

## 2014-06-27 NOTE — Telephone Encounter (Signed)
Medication Detail      Disp Refills Start End     LANTUS 100 UNIT/ML injection 30 mL 0 05/27/2014     Sig: inject 50 units subcutaneously every morning and 40 units subcutaneously at bedtime    Notes to Pharmacy: **30-DAY SUPPLY ONLY, PATIENT IS TO BEING GETTING DIABETIC MEDICATIONS FROM ENDOCRINOLOGY AND NEEDS APPOINTMENT**    E-Prescribing Status: Receipt confirmed by pharmacy (05/27/2014 2:47 PM EDT)    Ph[facility]acy    [facility] [place], [place]

## 2014-06-28 DIAGNOSIS — I10 Essential (primary) hypertension: Secondary | ICD-10-CM | POA: Diagnosis not present

## 2014-06-28 DIAGNOSIS — E789 Disorder of lipoprotein metabolism, unspecified: Secondary | ICD-10-CM | POA: Diagnosis not present

## 2014-06-28 DIAGNOSIS — E119 Type 2 diabetes mellitus without complications: Secondary | ICD-10-CM | POA: Diagnosis not present

## 2014-06-28 DIAGNOSIS — E291 Testicular hypofunction: Secondary | ICD-10-CM | POA: Diagnosis not present

## 2014-06-30 ENCOUNTER — Other Ambulatory Visit: Payer: Self-pay | Admitting: Family

## 2014-07-07 ENCOUNTER — Other Ambulatory Visit: Payer: Self-pay | Admitting: Physician Assistant

## 2014-07-07 NOTE — Telephone Encounter (Signed)
Rx request to pharmacy, 30-day only/SLS **PATIENT DUE FOR FOLLOW-UP OFFICE VISIT**

## 2014-07-20 DIAGNOSIS — E291 Testicular hypofunction: Secondary | ICD-10-CM | POA: Diagnosis not present

## 2014-07-20 DIAGNOSIS — E789 Disorder of lipoprotein metabolism, unspecified: Secondary | ICD-10-CM | POA: Diagnosis not present

## 2014-07-23 ENCOUNTER — Other Ambulatory Visit: Payer: Self-pay | Admitting: Physician Assistant

## 2014-07-25 DIAGNOSIS — E291 Testicular hypofunction: Secondary | ICD-10-CM | POA: Diagnosis not present

## 2014-07-25 NOTE — Telephone Encounter (Signed)
LMOM with contact name and number for return call RE: medication request and further provider instructions for Appointment required for future refills/SLS

## 2014-07-25 NOTE — Telephone Encounter (Signed)
Medication Detail      Disp Refills Start End     LANTUS 100 UNIT/ML injection 30 mL 0      Sig: inject 50 units subcutaneously every morning and 40 units at bedtime    Notes to Pharmacy: **30-DAY SUPPLY ONLY, PATIENT IS TO BEING GETTING DIABETIC MEDICATIONS FROM ENDOCRINOLOGY AND NEEDS APPOINTMENT**    E-Prescribing Status: Receipt confirmed by pharmacy (06/27/2014 9:29 AM     Patient also has not been seen in office since 11.26.14 and has no future appointments scheduled/SLS Please Advise on refills.

## 2014-07-29 ENCOUNTER — Telehealth: Payer: Self-pay | Admitting: Physician Assistant

## 2014-07-29 MED ORDER — INSULIN GLULISINE 100 UNIT/ML IJ SOLN: INTRAMUSCULAR | Status: DC

## 2014-07-29 NOTE — Telephone Encounter (Signed)
Lm on vm, awaiting call back from Pt to see what Endocrinologist he is going to see

## 2014-07-29 NOTE — Telephone Encounter (Signed)
Rx request to pharmacy/[facility]  

## 2014-07-29 NOTE — Telephone Encounter (Signed)
apidra 100 units  [facility] [address] [employer]

## 2014-07-29 NOTE — Telephone Encounter (Signed)
Ok to refill Apidra.  Further refills to come from Endocrinologist.

## 2014-07-29 NOTE — Telephone Encounter (Signed)
Pt is seeing Endocrinologist Dr Gareth Eagle

## 2014-07-29 NOTE — Addendum Note (Signed)
Addended by: Rockwell Germany on: 07/29/2014 11:07 AM   Modules accepted: Orders

## 2014-07-29 NOTE — Telephone Encounter (Signed)
Found error made in Referral process for 07.06.15 referral to Endocrinology; was sent in Error to Gastroenterology and them closed, not shown to have been corrected and forwarded to Endocrinology afterwards. Anderson Malta [PCC] given information to get patient into Endo [Dr. Kohut] ASAP d/t clerical error made/SLS

## 2014-08-12 ENCOUNTER — Other Ambulatory Visit: Payer: Self-pay | Admitting: Physician Assistant

## 2014-08-12 NOTE — Telephone Encounter (Signed)
Rx request to pharmacy/[facility]  

## 2014-08-15 DIAGNOSIS — E291 Testicular hypofunction: Secondary | ICD-10-CM | POA: Diagnosis not present

## 2014-08-16 DIAGNOSIS — E119 Type 2 diabetes mellitus without complications: Secondary | ICD-10-CM | POA: Diagnosis not present

## 2014-08-16 DIAGNOSIS — I10 Essential (primary) hypertension: Secondary | ICD-10-CM | POA: Diagnosis not present

## 2014-08-16 DIAGNOSIS — G4733 Obstructive sleep apnea (adult) (pediatric): Secondary | ICD-10-CM | POA: Diagnosis not present

## 2014-08-16 DIAGNOSIS — E785 Hyperlipidemia, unspecified: Secondary | ICD-10-CM | POA: Diagnosis not present

## 2014-08-18 ENCOUNTER — Encounter: Payer: Self-pay | Admitting: *Deleted

## 2014-08-18 ENCOUNTER — Encounter: Payer: Medicare Other | Attending: Endocrinology | Admitting: *Deleted

## 2014-08-18 VITALS — Ht 68.0 in | Wt 312.6 lb

## 2014-08-18 DIAGNOSIS — E119 Type 2 diabetes mellitus without complications: Secondary | ICD-10-CM | POA: Diagnosis not present

## 2014-08-18 DIAGNOSIS — Z794 Long term (current) use of insulin: Secondary | ICD-10-CM | POA: Diagnosis not present

## 2014-08-18 DIAGNOSIS — Z713 Dietary counseling and surveillance: Secondary | ICD-10-CM | POA: Insufficient documentation

## 2014-08-18 NOTE — Patient Instructions (Addendum)
Plan:  Aim for 3 Carb Choices per meal (45 grams) +/- 1 either way  Aim for 0-15 Carbs per snack if hungry  Include protein in moderation with your meals and snacks Consider reading food labels for Total Carbohydrate and Fat Grams of foods Consider  increasing your activity level by walking for 30 minutes daily as tolerated Continue checking BG at alternate times per day as directed by MD  Continue taking medication as directed by MD  Decrease fried foods Decrease portion sizes Continue to make good choices Do not adjust insulin at night Have carb/protein snack before bed each night

## 2014-08-18 NOTE — Progress Notes (Signed)
Appt start time: 0930 end time:  1100.  Assessment:  Patient was seen on  08/18/14 for individual diabetes education. With no solicitation he noted that he knew that his problem was his food portions. In review of [name] [name] dietary intake I concur. He generally makes reasonable food choices with excessive portions.  Patient Education Plan per assessed needs and concerns is to attend individual session for Diabetes Self Management Education.  Current HbA1c: 9.3%..... Patient notes that has been consistant for awhile  Preferred Learning Style:   No preference indicated   Learning Readiness:   Contemplating  MEDICATIONS: Lantus, Apidra... Notes that he adjusts his Apidra at night if glucose is too high. Then awakens about 2-3AM with hypoglycemia and has snack. I have discouraged him from modifying his insulin and encouraged a carb/protein snack at HS.  DIETARY INTAKE:  B ( AM): 2 eggs, 2 bacon, coffee, 2 bread / or skips  Snk ( AM):  none L ( PM): sandwich, [place], bologna or "just a salad" (lettuce, tomato, cucumber, egg,onion, dressing, croutons, cheese Snk ( PM): fritos, cheese puffs D ( PM): grilled chicken, POTATOES , green beans, ....Marland KitchenMarland KitchenYum Yum Hot dog.... 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   Intervention:  Nutrition counseling provided.  Discussed diabetes disease process and treatment options.  Discussed physiology of diabetes and role of obesity on insulin resistance.  Encouraged moderate weight reduction to improve glucose levels.  Discussed role of medications and diet in glucose control  Provided education on macronutrients on glucose levels.  Provided education on carb counting, importance of regularly scheduled meals/snacks, and meal planning  Discussed effects of physical activity on glucose  levels and long-term glucose control.  Recommended 150 minutes of physical activity/week.  Reviewed patient medications.  Discussed role of medication on blood glucose and possible side effects  Discussed blood glucose monitoring and interpretation.  Discussed recommended target ranges and individual ranges.    Described short-term complications: hyper- and hypo-glycemia.  Discussed causes,symptoms, and treatment options.  Discussed prevention, detection, and treatment of long-term complications.  Discussed the role of prolonged elevated glucose levels on body systems.  Plan:  Aim for 3 Carb Choices per meal (45 grams) +/- 1 either way  Aim for 0-15 Carbs per snack if hungry  Include protein in moderation with your meals and snacks Consider reading food labels for Total Carbohydrate and Fat Grams of foods Consider  increasing your activity level by walking for 30 minutes daily as tolerated. Start out with short walks to tolerance. Continue checking BG at alternate times per day as directed by MD  Continue taking medication as directed by MD  Decrease fried foods Decrease portion sizes Continue to make good choices Do not adjust insulin at night Have carb/protein snack before bed each night  Teaching Method Utilized:  Visual Auditory Hands on  Handouts given during visit include: Living Well with Diabetes Carb Counting and Food Label handouts Meal Plan Card My plate A1c Conversion Snack sheet  Barriers to learning/adherence to lifestyle change: will power  Diabetes self-care support plan:   [facility] support group  Family  Demonstrated degree of understanding via:  Teach Back   Monitoring/Evaluation:  Dietary intake, exercise, test glucose, and body weight follow up prn.

## 2014-08-19 ENCOUNTER — Other Ambulatory Visit: Payer: Self-pay | Admitting: Physician Assistant

## 2014-08-19 NOTE — Telephone Encounter (Signed)
Informed patient of medication refills and he declined to schedule follow up appointment at this time.

## 2014-08-19 NOTE — Telephone Encounter (Signed)
Rx sent to pharmacy. Pt needs to be seen before anymore refills can be given. Please call to schedule appt. Thank you. LDM

## 2014-08-26 ENCOUNTER — Other Ambulatory Visit: Payer: Self-pay | Admitting: Physician Assistant

## 2014-08-26 NOTE — Telephone Encounter (Signed)
Medication Detail      Disp Refills Start End     LANTUS 100 UNIT/ML injection 30 mL 0      Sig: inject 50 units subcutaneously every morning and 40 units at bedtime    Notes to Pharmacy: Make patient aware no additional refills until he is seen in clinic. NO EXCEPTIONS    E-Prescribing Status: Receipt confirmed by pharmacy (07/25/2014 10:08 AM EDT)    Denied per last Rx note by provider; Patient changed PCP on 08.20.15 to Dr. Jani Gravel, MD also.?/SLS

## 2014-08-26 NOTE — Telephone Encounter (Signed)
Not sure who provider is? Pt has seen Einar Pheasant and Lenna Sciara but PCP states Jani Gravel?

## 2014-08-29 ENCOUNTER — Telehealth: Payer: Self-pay

## 2014-08-29 NOTE — Telephone Encounter (Signed)
LVM for pt to call back and schedule CPE with PCP.

## 2014-08-30 DIAGNOSIS — M6530 Trigger finger, unspecified finger: Secondary | ICD-10-CM | POA: Diagnosis not present

## 2014-08-31 DIAGNOSIS — I10 Essential (primary) hypertension: Secondary | ICD-10-CM | POA: Diagnosis not present

## 2014-09-02 ENCOUNTER — Encounter: Payer: Self-pay | Admitting: Endocrinology

## 2014-09-06 DIAGNOSIS — E291 Testicular hypofunction: Secondary | ICD-10-CM | POA: Diagnosis not present

## 2014-09-07 ENCOUNTER — Other Ambulatory Visit: Payer: Self-pay | Admitting: Physician Assistant

## 2014-09-07 NOTE — Telephone Encounter (Signed)
Should I have this?

## 2014-09-20 DIAGNOSIS — E291 Testicular hypofunction: Secondary | ICD-10-CM | POA: Diagnosis not present

## 2014-09-22 ENCOUNTER — Other Ambulatory Visit: Payer: Self-pay | Admitting: Orthopaedic Surgery

## 2014-09-22 ENCOUNTER — Other Ambulatory Visit: Payer: Self-pay | Admitting: Physician Assistant

## 2014-09-22 DIAGNOSIS — M25519 Pain in unspecified shoulder: Secondary | ICD-10-CM | POA: Diagnosis not present

## 2014-09-22 DIAGNOSIS — M67919 Unspecified disorder of synovium and tendon, unspecified shoulder: Secondary | ICD-10-CM | POA: Diagnosis not present

## 2014-09-22 DIAGNOSIS — M719 Bursopathy, unspecified: Secondary | ICD-10-CM | POA: Diagnosis not present

## 2014-09-22 DIAGNOSIS — M25511 Pain in right shoulder: Secondary | ICD-10-CM

## 2014-09-27 DIAGNOSIS — E291 Testicular hypofunction: Secondary | ICD-10-CM | POA: Diagnosis not present

## 2014-10-02 ENCOUNTER — Ambulatory Visit
Admission: RE | Admit: 2014-10-02 | Discharge: 2014-10-02 | Disposition: A | Discharge status: Home / Self Care | Payer: Medicare Other | Source: Ambulatory Visit | Attending: Orthopaedic Surgery | Admitting: Orthopaedic Surgery

## 2014-10-02 DIAGNOSIS — S46811A Strain of other muscles, fascia and tendons at shoulder and upper arm level, right arm, initial encounter: Secondary | ICD-10-CM | POA: Diagnosis not present

## 2014-10-02 DIAGNOSIS — M25511 Pain in right shoulder: Secondary | ICD-10-CM

## 2014-10-06 DIAGNOSIS — M7551 Bursitis of right shoulder: Secondary | ICD-10-CM | POA: Diagnosis not present

## 2014-10-06 DIAGNOSIS — M75121 Complete rotator cuff tear or rupture of right shoulder, not specified as traumatic: Secondary | ICD-10-CM | POA: Diagnosis not present

## 2014-10-06 DIAGNOSIS — M25511 Pain in right shoulder: Secondary | ICD-10-CM | POA: Diagnosis not present

## 2014-10-11 DIAGNOSIS — E756 Lipid storage disorder, unspecified: Secondary | ICD-10-CM | POA: Diagnosis not present

## 2014-10-11 DIAGNOSIS — E119 Type 2 diabetes mellitus without complications: Secondary | ICD-10-CM | POA: Diagnosis not present

## 2014-10-11 DIAGNOSIS — E039 Hypothyroidism, unspecified: Secondary | ICD-10-CM | POA: Diagnosis not present

## 2014-10-11 DIAGNOSIS — Z125 Encounter for screening for malignant neoplasm of prostate: Secondary | ICD-10-CM | POA: Diagnosis not present

## 2014-10-11 DIAGNOSIS — I10 Essential (primary) hypertension: Secondary | ICD-10-CM | POA: Diagnosis not present

## 2014-10-13 DIAGNOSIS — M7521 Bicipital tendinitis, right shoulder: Secondary | ICD-10-CM | POA: Diagnosis not present

## 2014-10-13 DIAGNOSIS — M19011 Primary osteoarthritis, right shoulder: Secondary | ICD-10-CM | POA: Diagnosis not present

## 2014-10-13 DIAGNOSIS — Y998 Other external cause status: Secondary | ICD-10-CM | POA: Diagnosis not present

## 2014-10-13 DIAGNOSIS — M75121 Complete rotator cuff tear or rupture of right shoulder, not specified as traumatic: Secondary | ICD-10-CM | POA: Diagnosis not present

## 2014-10-13 DIAGNOSIS — M7541 Impingement syndrome of right shoulder: Secondary | ICD-10-CM | POA: Diagnosis not present

## 2014-10-13 DIAGNOSIS — S43421A Sprain of right rotator cuff capsule, initial encounter: Secondary | ICD-10-CM | POA: Diagnosis not present

## 2014-10-13 DIAGNOSIS — G8918 Other acute postprocedural pain: Secondary | ICD-10-CM | POA: Diagnosis not present

## 2014-10-13 DIAGNOSIS — Y929 Unspecified place or not applicable: Secondary | ICD-10-CM | POA: Diagnosis not present

## 2014-10-14 ENCOUNTER — Other Ambulatory Visit: Payer: Self-pay

## 2014-10-18 DIAGNOSIS — N400 Enlarged prostate without lower urinary tract symptoms: Secondary | ICD-10-CM | POA: Diagnosis not present

## 2014-10-18 DIAGNOSIS — R972 Elevated prostate specific antigen [PSA]: Secondary | ICD-10-CM | POA: Diagnosis not present

## 2014-10-18 DIAGNOSIS — I10 Essential (primary) hypertension: Secondary | ICD-10-CM | POA: Diagnosis not present

## 2014-10-18 DIAGNOSIS — E119 Type 2 diabetes mellitus without complications: Secondary | ICD-10-CM | POA: Diagnosis not present

## 2014-10-19 ENCOUNTER — Ambulatory Visit: Payer: Medicare Other | Attending: Physical Therapy | Admitting: Physical Therapy

## 2014-10-19 DIAGNOSIS — I10 Essential (primary) hypertension: Secondary | ICD-10-CM | POA: Diagnosis not present

## 2014-10-19 DIAGNOSIS — M25619 Stiffness of unspecified shoulder, not elsewhere classified: Secondary | ICD-10-CM | POA: Insufficient documentation

## 2014-10-19 DIAGNOSIS — E669 Obesity, unspecified: Secondary | ICD-10-CM | POA: Insufficient documentation

## 2014-10-19 DIAGNOSIS — M25519 Pain in unspecified shoulder: Secondary | ICD-10-CM | POA: Diagnosis not present

## 2014-10-19 DIAGNOSIS — Z96652 Presence of left artificial knee joint: Secondary | ICD-10-CM | POA: Insufficient documentation

## 2014-10-19 DIAGNOSIS — E119 Type 2 diabetes mellitus without complications: Secondary | ICD-10-CM | POA: Insufficient documentation

## 2014-10-19 DIAGNOSIS — Z5189 Encounter for other specified aftercare: Secondary | ICD-10-CM | POA: Insufficient documentation

## 2014-10-19 DIAGNOSIS — R609 Edema, unspecified: Secondary | ICD-10-CM | POA: Insufficient documentation

## 2014-10-20 DIAGNOSIS — Z23 Encounter for immunization: Secondary | ICD-10-CM | POA: Diagnosis not present

## 2014-10-20 DIAGNOSIS — I10 Essential (primary) hypertension: Secondary | ICD-10-CM | POA: Diagnosis not present

## 2014-10-20 DIAGNOSIS — E032 Hypothyroidism due to medicaments and other exogenous substances: Secondary | ICD-10-CM | POA: Diagnosis not present

## 2014-10-21 ENCOUNTER — Ambulatory Visit: Payer: Medicare Other | Admitting: Physical Therapy

## 2014-10-21 DIAGNOSIS — M25519 Pain in unspecified shoulder: Secondary | ICD-10-CM | POA: Diagnosis not present

## 2014-10-21 DIAGNOSIS — E119 Type 2 diabetes mellitus without complications: Secondary | ICD-10-CM | POA: Diagnosis not present

## 2014-10-21 DIAGNOSIS — M25619 Stiffness of unspecified shoulder, not elsewhere classified: Secondary | ICD-10-CM | POA: Diagnosis not present

## 2014-10-21 DIAGNOSIS — Z5189 Encounter for other specified aftercare: Secondary | ICD-10-CM | POA: Diagnosis not present

## 2014-10-21 DIAGNOSIS — I10 Essential (primary) hypertension: Secondary | ICD-10-CM | POA: Diagnosis not present

## 2014-10-21 DIAGNOSIS — R609 Edema, unspecified: Secondary | ICD-10-CM | POA: Diagnosis not present

## 2014-10-25 ENCOUNTER — Ambulatory Visit: Payer: Medicare Other | Admitting: Physical Therapy

## 2014-10-25 DIAGNOSIS — R609 Edema, unspecified: Secondary | ICD-10-CM | POA: Diagnosis not present

## 2014-10-25 DIAGNOSIS — E119 Type 2 diabetes mellitus without complications: Secondary | ICD-10-CM | POA: Diagnosis not present

## 2014-10-25 DIAGNOSIS — I10 Essential (primary) hypertension: Secondary | ICD-10-CM | POA: Diagnosis not present

## 2014-10-25 DIAGNOSIS — Z5189 Encounter for other specified aftercare: Secondary | ICD-10-CM | POA: Diagnosis not present

## 2014-10-25 DIAGNOSIS — M25619 Stiffness of unspecified shoulder, not elsewhere classified: Secondary | ICD-10-CM | POA: Diagnosis not present

## 2014-10-25 DIAGNOSIS — M25519 Pain in unspecified shoulder: Secondary | ICD-10-CM | POA: Diagnosis not present

## 2014-10-27 ENCOUNTER — Ambulatory Visit: Payer: Medicare Other | Admitting: Physical Therapy

## 2014-10-27 DIAGNOSIS — I10 Essential (primary) hypertension: Secondary | ICD-10-CM | POA: Diagnosis not present

## 2014-10-27 DIAGNOSIS — M25519 Pain in unspecified shoulder: Secondary | ICD-10-CM | POA: Diagnosis not present

## 2014-10-27 DIAGNOSIS — Z5189 Encounter for other specified aftercare: Secondary | ICD-10-CM | POA: Diagnosis not present

## 2014-10-27 DIAGNOSIS — R609 Edema, unspecified: Secondary | ICD-10-CM | POA: Diagnosis not present

## 2014-10-27 DIAGNOSIS — E119 Type 2 diabetes mellitus without complications: Secondary | ICD-10-CM | POA: Diagnosis not present

## 2014-10-27 DIAGNOSIS — M25619 Stiffness of unspecified shoulder, not elsewhere classified: Secondary | ICD-10-CM | POA: Diagnosis not present

## 2014-11-01 ENCOUNTER — Ambulatory Visit: Payer: Medicare Other | Attending: Orthopaedic Surgery | Admitting: Physical Therapy

## 2014-11-01 DIAGNOSIS — E119 Type 2 diabetes mellitus without complications: Secondary | ICD-10-CM | POA: Diagnosis not present

## 2014-11-01 DIAGNOSIS — E669 Obesity, unspecified: Secondary | ICD-10-CM | POA: Insufficient documentation

## 2014-11-01 DIAGNOSIS — Z96652 Presence of left artificial knee joint: Secondary | ICD-10-CM | POA: Diagnosis not present

## 2014-11-01 DIAGNOSIS — M25519 Pain in unspecified shoulder: Secondary | ICD-10-CM | POA: Diagnosis not present

## 2014-11-01 DIAGNOSIS — M25619 Stiffness of unspecified shoulder, not elsewhere classified: Secondary | ICD-10-CM | POA: Insufficient documentation

## 2014-11-01 DIAGNOSIS — I10 Essential (primary) hypertension: Secondary | ICD-10-CM | POA: Diagnosis not present

## 2014-11-01 DIAGNOSIS — Z5189 Encounter for other specified aftercare: Secondary | ICD-10-CM | POA: Insufficient documentation

## 2014-11-01 DIAGNOSIS — R609 Edema, unspecified: Secondary | ICD-10-CM | POA: Insufficient documentation

## 2014-11-03 ENCOUNTER — Ambulatory Visit: Payer: Medicare Other | Admitting: Physical Therapy

## 2014-11-03 DIAGNOSIS — Z5189 Encounter for other specified aftercare: Secondary | ICD-10-CM | POA: Diagnosis not present

## 2014-11-07 ENCOUNTER — Ambulatory Visit: Payer: Medicare Other | Admitting: Physical Therapy

## 2014-11-07 DIAGNOSIS — Z5189 Encounter for other specified aftercare: Secondary | ICD-10-CM | POA: Diagnosis not present

## 2014-11-09 DIAGNOSIS — E291 Testicular hypofunction: Secondary | ICD-10-CM | POA: Diagnosis not present

## 2014-11-11 ENCOUNTER — Ambulatory Visit: Payer: Medicare Other | Admitting: Physical Therapy

## 2014-11-11 DIAGNOSIS — Z5189 Encounter for other specified aftercare: Secondary | ICD-10-CM | POA: Diagnosis not present

## 2014-11-15 ENCOUNTER — Ambulatory Visit: Payer: Medicare Other | Admitting: Physical Therapy

## 2014-11-18 ENCOUNTER — Ambulatory Visit: Payer: Medicare Other | Admitting: Physical Therapy

## 2014-11-22 ENCOUNTER — Ambulatory Visit: Payer: Medicare Other | Admitting: Physical Therapy

## 2014-11-22 DIAGNOSIS — R972 Elevated prostate specific antigen [PSA]: Secondary | ICD-10-CM | POA: Diagnosis not present

## 2014-11-22 DIAGNOSIS — N401 Enlarged prostate with lower urinary tract symptoms: Secondary | ICD-10-CM | POA: Diagnosis not present

## 2014-11-22 DIAGNOSIS — R3912 Poor urinary stream: Secondary | ICD-10-CM | POA: Diagnosis not present

## 2014-11-22 DIAGNOSIS — Z5189 Encounter for other specified aftercare: Secondary | ICD-10-CM | POA: Diagnosis not present

## 2014-11-22 DIAGNOSIS — R351 Nocturia: Secondary | ICD-10-CM | POA: Diagnosis not present

## 2014-11-22 DIAGNOSIS — E291 Testicular hypofunction: Secondary | ICD-10-CM | POA: Diagnosis not present

## 2014-11-29 ENCOUNTER — Ambulatory Visit: Payer: Medicare Other | Attending: Orthopaedic Surgery | Admitting: Physical Therapy

## 2014-11-29 DIAGNOSIS — I10 Essential (primary) hypertension: Secondary | ICD-10-CM | POA: Diagnosis not present

## 2014-11-29 DIAGNOSIS — M25619 Stiffness of unspecified shoulder, not elsewhere classified: Secondary | ICD-10-CM | POA: Diagnosis not present

## 2014-11-29 DIAGNOSIS — Z5189 Encounter for other specified aftercare: Secondary | ICD-10-CM | POA: Insufficient documentation

## 2014-11-29 DIAGNOSIS — Z96652 Presence of left artificial knee joint: Secondary | ICD-10-CM | POA: Diagnosis not present

## 2014-11-29 DIAGNOSIS — R609 Edema, unspecified: Secondary | ICD-10-CM | POA: Insufficient documentation

## 2014-11-29 DIAGNOSIS — E669 Obesity, unspecified: Secondary | ICD-10-CM | POA: Diagnosis not present

## 2014-11-29 DIAGNOSIS — E119 Type 2 diabetes mellitus without complications: Secondary | ICD-10-CM | POA: Diagnosis not present

## 2014-11-29 DIAGNOSIS — M25519 Pain in unspecified shoulder: Secondary | ICD-10-CM | POA: Diagnosis not present

## 2014-11-30 DIAGNOSIS — E291 Testicular hypofunction: Secondary | ICD-10-CM | POA: Diagnosis not present

## 2014-12-01 ENCOUNTER — Ambulatory Visit: Payer: Medicare Other | Admitting: Physical Therapy

## 2014-12-01 DIAGNOSIS — M25519 Pain in unspecified shoulder: Secondary | ICD-10-CM | POA: Diagnosis not present

## 2014-12-01 DIAGNOSIS — I10 Essential (primary) hypertension: Secondary | ICD-10-CM | POA: Diagnosis not present

## 2014-12-01 DIAGNOSIS — R609 Edema, unspecified: Secondary | ICD-10-CM | POA: Diagnosis not present

## 2014-12-01 DIAGNOSIS — Z5189 Encounter for other specified aftercare: Secondary | ICD-10-CM | POA: Diagnosis not present

## 2014-12-01 DIAGNOSIS — E119 Type 2 diabetes mellitus without complications: Secondary | ICD-10-CM | POA: Diagnosis not present

## 2014-12-01 DIAGNOSIS — M25619 Stiffness of unspecified shoulder, not elsewhere classified: Secondary | ICD-10-CM | POA: Diagnosis not present

## 2014-12-05 ENCOUNTER — Ambulatory Visit: Payer: Medicare Other | Admitting: Physical Therapy

## 2014-12-05 DIAGNOSIS — R609 Edema, unspecified: Secondary | ICD-10-CM | POA: Diagnosis not present

## 2014-12-05 DIAGNOSIS — M25619 Stiffness of unspecified shoulder, not elsewhere classified: Secondary | ICD-10-CM | POA: Diagnosis not present

## 2014-12-05 DIAGNOSIS — M25519 Pain in unspecified shoulder: Secondary | ICD-10-CM | POA: Diagnosis not present

## 2014-12-05 DIAGNOSIS — E119 Type 2 diabetes mellitus without complications: Secondary | ICD-10-CM | POA: Diagnosis not present

## 2014-12-05 DIAGNOSIS — I10 Essential (primary) hypertension: Secondary | ICD-10-CM | POA: Diagnosis not present

## 2014-12-05 DIAGNOSIS — Z5189 Encounter for other specified aftercare: Secondary | ICD-10-CM | POA: Diagnosis not present

## 2014-12-09 ENCOUNTER — Ambulatory Visit: Payer: Medicare Other | Admitting: Physical Therapy

## 2014-12-09 DIAGNOSIS — Z5189 Encounter for other specified aftercare: Secondary | ICD-10-CM | POA: Diagnosis not present

## 2014-12-09 DIAGNOSIS — M25619 Stiffness of unspecified shoulder, not elsewhere classified: Secondary | ICD-10-CM | POA: Diagnosis not present

## 2014-12-09 DIAGNOSIS — E119 Type 2 diabetes mellitus without complications: Secondary | ICD-10-CM | POA: Diagnosis not present

## 2014-12-09 DIAGNOSIS — I10 Essential (primary) hypertension: Secondary | ICD-10-CM | POA: Diagnosis not present

## 2014-12-09 DIAGNOSIS — M25519 Pain in unspecified shoulder: Secondary | ICD-10-CM | POA: Diagnosis not present

## 2014-12-09 DIAGNOSIS — R609 Edema, unspecified: Secondary | ICD-10-CM | POA: Diagnosis not present

## 2014-12-13 ENCOUNTER — Ambulatory Visit: Payer: Medicare Other | Admitting: Physical Therapy

## 2014-12-13 DIAGNOSIS — I10 Essential (primary) hypertension: Secondary | ICD-10-CM | POA: Diagnosis not present

## 2014-12-13 DIAGNOSIS — M25619 Stiffness of unspecified shoulder, not elsewhere classified: Secondary | ICD-10-CM | POA: Diagnosis not present

## 2014-12-13 DIAGNOSIS — Z5189 Encounter for other specified aftercare: Secondary | ICD-10-CM | POA: Diagnosis not present

## 2014-12-13 DIAGNOSIS — M25519 Pain in unspecified shoulder: Secondary | ICD-10-CM | POA: Diagnosis not present

## 2014-12-13 DIAGNOSIS — R609 Edema, unspecified: Secondary | ICD-10-CM | POA: Diagnosis not present

## 2014-12-13 DIAGNOSIS — E119 Type 2 diabetes mellitus without complications: Secondary | ICD-10-CM | POA: Diagnosis not present

## 2014-12-14 DIAGNOSIS — E118 Type 2 diabetes mellitus with unspecified complications: Secondary | ICD-10-CM | POA: Diagnosis not present

## 2014-12-15 ENCOUNTER — Ambulatory Visit: Payer: Medicare Other | Admitting: Physical Therapy

## 2014-12-15 DIAGNOSIS — I10 Essential (primary) hypertension: Secondary | ICD-10-CM | POA: Diagnosis not present

## 2014-12-15 DIAGNOSIS — M25519 Pain in unspecified shoulder: Secondary | ICD-10-CM | POA: Diagnosis not present

## 2014-12-15 DIAGNOSIS — E119 Type 2 diabetes mellitus without complications: Secondary | ICD-10-CM | POA: Diagnosis not present

## 2014-12-15 DIAGNOSIS — R609 Edema, unspecified: Secondary | ICD-10-CM | POA: Diagnosis not present

## 2014-12-15 DIAGNOSIS — M25619 Stiffness of unspecified shoulder, not elsewhere classified: Secondary | ICD-10-CM | POA: Diagnosis not present

## 2014-12-15 DIAGNOSIS — Z5189 Encounter for other specified aftercare: Secondary | ICD-10-CM | POA: Diagnosis not present

## 2014-12-19 ENCOUNTER — Ambulatory Visit: Payer: Medicare Other | Admitting: Physical Therapy

## 2014-12-19 DIAGNOSIS — Z5189 Encounter for other specified aftercare: Secondary | ICD-10-CM | POA: Diagnosis not present

## 2014-12-19 DIAGNOSIS — M25519 Pain in unspecified shoulder: Secondary | ICD-10-CM | POA: Diagnosis not present

## 2014-12-19 DIAGNOSIS — M25619 Stiffness of unspecified shoulder, not elsewhere classified: Secondary | ICD-10-CM | POA: Diagnosis not present

## 2014-12-19 DIAGNOSIS — R609 Edema, unspecified: Secondary | ICD-10-CM | POA: Diagnosis not present

## 2014-12-19 DIAGNOSIS — I10 Essential (primary) hypertension: Secondary | ICD-10-CM | POA: Diagnosis not present

## 2014-12-19 DIAGNOSIS — E119 Type 2 diabetes mellitus without complications: Secondary | ICD-10-CM | POA: Diagnosis not present

## 2014-12-20 DIAGNOSIS — H2513 Age-related nuclear cataract, bilateral: Secondary | ICD-10-CM | POA: Diagnosis not present

## 2014-12-20 DIAGNOSIS — E789 Disorder of lipoprotein metabolism, unspecified: Secondary | ICD-10-CM | POA: Diagnosis not present

## 2014-12-20 DIAGNOSIS — E291 Testicular hypofunction: Secondary | ICD-10-CM | POA: Diagnosis not present

## 2014-12-20 DIAGNOSIS — I10 Essential (primary) hypertension: Secondary | ICD-10-CM | POA: Diagnosis not present

## 2014-12-20 DIAGNOSIS — E119 Type 2 diabetes mellitus without complications: Secondary | ICD-10-CM | POA: Diagnosis not present

## 2014-12-21 DIAGNOSIS — I493 Ventricular premature depolarization: Secondary | ICD-10-CM | POA: Diagnosis not present

## 2014-12-21 DIAGNOSIS — R600 Localized edema: Secondary | ICD-10-CM | POA: Diagnosis not present

## 2014-12-21 DIAGNOSIS — I2510 Atherosclerotic heart disease of native coronary artery without angina pectoris: Secondary | ICD-10-CM | POA: Diagnosis not present

## 2014-12-21 DIAGNOSIS — R0602 Shortness of breath: Secondary | ICD-10-CM | POA: Diagnosis not present

## 2014-12-22 ENCOUNTER — Ambulatory Visit: Payer: Medicare Other | Admitting: Physical Therapy

## 2014-12-22 DIAGNOSIS — M25619 Stiffness of unspecified shoulder, not elsewhere classified: Secondary | ICD-10-CM | POA: Diagnosis not present

## 2014-12-22 DIAGNOSIS — I10 Essential (primary) hypertension: Secondary | ICD-10-CM | POA: Diagnosis not present

## 2014-12-22 DIAGNOSIS — R609 Edema, unspecified: Secondary | ICD-10-CM | POA: Diagnosis not present

## 2014-12-22 DIAGNOSIS — Z5189 Encounter for other specified aftercare: Secondary | ICD-10-CM | POA: Diagnosis not present

## 2014-12-22 DIAGNOSIS — M25519 Pain in unspecified shoulder: Secondary | ICD-10-CM | POA: Diagnosis not present

## 2014-12-22 DIAGNOSIS — E119 Type 2 diabetes mellitus without complications: Secondary | ICD-10-CM | POA: Diagnosis not present

## 2014-12-28 DIAGNOSIS — C61 Malignant neoplasm of prostate: Secondary | ICD-10-CM | POA: Diagnosis not present

## 2014-12-28 DIAGNOSIS — R0602 Shortness of breath: Secondary | ICD-10-CM | POA: Diagnosis not present

## 2014-12-28 DIAGNOSIS — R972 Elevated prostate specific antigen [PSA]: Secondary | ICD-10-CM | POA: Diagnosis not present

## 2015-01-02 DIAGNOSIS — I10 Essential (primary) hypertension: Secondary | ICD-10-CM | POA: Diagnosis not present

## 2015-01-09 DIAGNOSIS — C61 Malignant neoplasm of prostate: Secondary | ICD-10-CM | POA: Diagnosis not present

## 2015-01-09 DIAGNOSIS — E291 Testicular hypofunction: Secondary | ICD-10-CM | POA: Diagnosis not present

## 2015-01-09 DIAGNOSIS — I2510 Atherosclerotic heart disease of native coronary artery without angina pectoris: Secondary | ICD-10-CM | POA: Diagnosis not present

## 2015-01-09 DIAGNOSIS — R3912 Poor urinary stream: Secondary | ICD-10-CM | POA: Diagnosis not present

## 2015-01-09 DIAGNOSIS — I10 Essential (primary) hypertension: Secondary | ICD-10-CM | POA: Diagnosis not present

## 2015-01-09 DIAGNOSIS — I493 Ventricular premature depolarization: Secondary | ICD-10-CM | POA: Diagnosis not present

## 2015-01-09 DIAGNOSIS — R972 Elevated prostate specific antigen [PSA]: Secondary | ICD-10-CM | POA: Diagnosis not present

## 2015-01-09 DIAGNOSIS — R0602 Shortness of breath: Secondary | ICD-10-CM | POA: Diagnosis not present

## 2015-01-09 DIAGNOSIS — R351 Nocturia: Secondary | ICD-10-CM | POA: Diagnosis not present

## 2015-01-19 DIAGNOSIS — E784 Other hyperlipidemia: Secondary | ICD-10-CM | POA: Diagnosis not present

## 2015-01-19 DIAGNOSIS — E039 Hypothyroidism, unspecified: Secondary | ICD-10-CM | POA: Diagnosis not present

## 2015-01-19 DIAGNOSIS — I10 Essential (primary) hypertension: Secondary | ICD-10-CM | POA: Diagnosis not present

## 2015-01-19 DIAGNOSIS — E119 Type 2 diabetes mellitus without complications: Secondary | ICD-10-CM | POA: Diagnosis not present

## 2015-01-23 DIAGNOSIS — I2510 Atherosclerotic heart disease of native coronary artery without angina pectoris: Secondary | ICD-10-CM | POA: Diagnosis not present

## 2015-01-23 DIAGNOSIS — Z6841 Body Mass Index (BMI) 40.0 and over, adult: Secondary | ICD-10-CM | POA: Diagnosis not present

## 2015-01-23 DIAGNOSIS — R0602 Shortness of breath: Secondary | ICD-10-CM | POA: Diagnosis not present

## 2015-01-24 ENCOUNTER — Other Ambulatory Visit: Payer: Self-pay | Admitting: Cardiology

## 2015-01-24 DIAGNOSIS — I872 Venous insufficiency (chronic) (peripheral): Secondary | ICD-10-CM

## 2015-01-26 ENCOUNTER — Ambulatory Visit
Admission: RE | Admit: 2015-01-26 | Discharge: 2015-01-26 | Disposition: A | Discharge status: Home / Self Care | Payer: Medicare Other | Source: Ambulatory Visit | Attending: Cardiology | Admitting: Cardiology

## 2015-01-26 DIAGNOSIS — I872 Venous insufficiency (chronic) (peripheral): Secondary | ICD-10-CM | POA: Diagnosis not present

## 2015-01-26 DIAGNOSIS — R600 Localized edema: Secondary | ICD-10-CM | POA: Diagnosis not present

## 2015-01-26 DIAGNOSIS — I83893 Varicose veins of bilateral lower extremities with other complications: Secondary | ICD-10-CM | POA: Diagnosis not present

## 2015-01-26 HISTORY — DX: Peripheral vascular disease, unspecified: I73.9
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
## 2015-01-27 ENCOUNTER — Other Ambulatory Visit: Payer: Self-pay | Admitting: Urology

## 2015-02-08 ENCOUNTER — Encounter (HOSPITAL_BASED_OUTPATIENT_CLINIC_OR_DEPARTMENT_OTHER): Payer: Self-pay | Admitting: *Deleted

## 2015-02-08 NOTE — Progress Notes (Signed)
NPO AFTER MN. ARRIVE AT 0915. NEEDS ISTAT.  CURRENT EKG TO BE FAXED FROM DR Einar Gip. WILL TAKE TOPROL, NORVASC, BIDIL, AND SYNTHROID AM DOS W/ SIPS OF WATER.

## 2015-02-13 ENCOUNTER — Encounter (HOSPITAL_BASED_OUTPATIENT_CLINIC_OR_DEPARTMENT_OTHER): Payer: Self-pay

## 2015-02-13 ENCOUNTER — Encounter (HOSPITAL_BASED_OUTPATIENT_CLINIC_OR_DEPARTMENT_OTHER)
Admission: RE | Disposition: A | Discharge status: Home / Self Care | Payer: Self-pay | Source: Ambulatory Visit | Attending: Urology

## 2015-02-13 ENCOUNTER — Ambulatory Visit (HOSPITAL_BASED_OUTPATIENT_CLINIC_OR_DEPARTMENT_OTHER)
Admission: RE | Admit: 2015-02-13 | Discharge: 2015-02-13 | Disposition: A | Discharge status: Home / Self Care | Payer: Medicare Other | Source: Ambulatory Visit | Attending: Urology | Admitting: Urology

## 2015-02-13 ENCOUNTER — Ambulatory Visit (HOSPITAL_BASED_OUTPATIENT_CLINIC_OR_DEPARTMENT_OTHER): Payer: Medicare Other | Admitting: Anesthesiology

## 2015-02-13 DIAGNOSIS — E039 Hypothyroidism, unspecified: Secondary | ICD-10-CM | POA: Diagnosis not present

## 2015-02-13 DIAGNOSIS — K219 Gastro-esophageal reflux disease without esophagitis: Secondary | ICD-10-CM | POA: Insufficient documentation

## 2015-02-13 DIAGNOSIS — E119 Type 2 diabetes mellitus without complications: Secondary | ICD-10-CM | POA: Insufficient documentation

## 2015-02-13 DIAGNOSIS — I2510 Atherosclerotic heart disease of native coronary artery without angina pectoris: Secondary | ICD-10-CM | POA: Insufficient documentation

## 2015-02-13 DIAGNOSIS — N320 Bladder-neck obstruction: Secondary | ICD-10-CM | POA: Diagnosis not present

## 2015-02-13 DIAGNOSIS — R339 Retention of urine, unspecified: Secondary | ICD-10-CM | POA: Diagnosis not present

## 2015-02-13 DIAGNOSIS — I739 Peripheral vascular disease, unspecified: Secondary | ICD-10-CM | POA: Insufficient documentation

## 2015-02-13 DIAGNOSIS — C61 Malignant neoplasm of prostate: Secondary | ICD-10-CM | POA: Insufficient documentation

## 2015-02-13 DIAGNOSIS — G4730 Sleep apnea, unspecified: Secondary | ICD-10-CM | POA: Diagnosis not present

## 2015-02-13 DIAGNOSIS — Z809 Family history of malignant neoplasm, unspecified: Secondary | ICD-10-CM | POA: Diagnosis not present

## 2015-02-13 DIAGNOSIS — I10 Essential (primary) hypertension: Secondary | ICD-10-CM | POA: Diagnosis not present

## 2015-02-13 DIAGNOSIS — Z794 Long term (current) use of insulin: Secondary | ICD-10-CM | POA: Insufficient documentation

## 2015-02-13 DIAGNOSIS — Z6841 Body Mass Index (BMI) 40.0 and over, adult: Secondary | ICD-10-CM | POA: Diagnosis not present

## 2015-02-13 DIAGNOSIS — Z87891 Personal history of nicotine dependence: Secondary | ICD-10-CM | POA: Diagnosis not present

## 2015-02-13 DIAGNOSIS — E291 Testicular hypofunction: Secondary | ICD-10-CM | POA: Insufficient documentation

## 2015-02-13 DIAGNOSIS — E669 Obesity, unspecified: Secondary | ICD-10-CM | POA: Insufficient documentation

## 2015-02-13 DIAGNOSIS — Z9989 Dependence on other enabling machines and devices: Secondary | ICD-10-CM | POA: Diagnosis not present

## 2015-02-13 DIAGNOSIS — M1990 Unspecified osteoarthritis, unspecified site: Secondary | ICD-10-CM | POA: Diagnosis not present

## 2015-02-13 HISTORY — DX: Dependence on other enabling machines and devices: Z99.89

## 2015-02-13 HISTORY — DX: Type 2 diabetes mellitus without complications: E11.9

## 2015-02-13 HISTORY — DX: Personal history of diseases of the skin and subcutaneous tissue: Z87.2

## 2015-02-13 HISTORY — DX: Bladder-neck obstruction: N32.0

## 2015-02-13 HISTORY — DX: Personal history of other (healed) physical injury and trauma: Z87.828

## 2015-02-13 HISTORY — DX: Nocturia: R35.1

## 2015-02-13 HISTORY — PX: CRYOABLATION: SHX1415

## 2015-02-13 HISTORY — DX: Malignant neoplasm of prostate: C61

## 2015-02-13 HISTORY — PX: CYSTOSCOPY: SHX5120

## 2015-02-13 HISTORY — DX: Obstructive sleep apnea (adult) (pediatric): G47.33

## 2015-02-13 HISTORY — DX: Ventricular premature depolarization: I49.3

## 2015-02-13 HISTORY — DX: Presence of spectacles and contact lenses: Z97.3

## 2015-02-13 HISTORY — DX: Localized edema: R60.0

## 2015-02-13 HISTORY — DX: Presence of external hearing-aid: Z97.4

## 2015-02-13 HISTORY — DX: Personal history of other diseases of the digestive system: Z87.19

## 2015-02-13 HISTORY — PX: INSERTION OF SUPRAPUBIC CATHETER: SHX5870

## 2015-02-13 LAB — POCT I-STAT 4, (NA,K, GLUC, HGB,HCT)
Glucose, Bld: 162 mg/dL — ABNORMAL HIGH (ref 70–99)
HCT: 48 % (ref 39.0–52.0)
Hemoglobin: 16.3 g/dL (ref 13.0–17.0)
POTASSIUM: 3.9 mmol/L (ref 3.5–5.1)
SODIUM: 140 mmol/L (ref 135–145)

## 2015-02-13 LAB — GLUCOSE, CAPILLARY: GLUCOSE-CAPILLARY: 189 mg/dL — AB (ref 70–99)

## 2015-02-13 SURGERY — CYSTOSCOPY
Anesthesia: General | Site: Prostate

## 2015-02-13 MED ORDER — LIDOCAINE HCL (CARDIAC) 20 MG/ML IV SOLN
INTRAVENOUS | Status: DC | PRN
  Administered 2015-02-13: 100 mg via INTRAVENOUS

## 2015-02-13 MED ORDER — OXYCODONE-ACETAMINOPHEN 5-325 MG PO TABS: 1.0000 | ORAL_TABLET | ORAL | Status: DC | PRN

## 2015-02-13 MED ORDER — EPHEDRINE SULFATE 50 MG/ML IJ SOLN
INTRAMUSCULAR | Status: DC | PRN
  Administered 2015-02-13 (×2): 10 mg via INTRAVENOUS

## 2015-02-13 MED ORDER — LACTATED RINGERS IV SOLN
INTRAVENOUS | Status: DC
  Administered 2015-02-13: 10:00:00 via INTRAVENOUS
  Filled 2015-02-13: qty 1000

## 2015-02-13 MED ORDER — MIDAZOLAM HCL 2 MG/2ML IJ SOLN
INTRAMUSCULAR | Status: AC
  Filled 2015-02-13: qty 2

## 2015-02-13 MED ORDER — OXYBUTYNIN CHLORIDE ER 10 MG PO TB24
10.0000 mg | ORAL_TABLET | Freq: Once | ORAL | Status: AC
  Administered 2015-02-13: 10 mg via ORAL
  Filled 2015-02-13: qty 1

## 2015-02-13 MED ORDER — DEXAMETHASONE SODIUM PHOSPHATE 4 MG/ML IJ SOLN
INTRAMUSCULAR | Status: DC | PRN
  Administered 2015-02-13: 4 mg via INTRAVENOUS

## 2015-02-13 MED ORDER — OXYBUTYNIN CHLORIDE ER 10 MG PO TB24
ORAL_TABLET | ORAL | Status: AC
  Filled 2015-02-13: qty 1

## 2015-02-13 MED ORDER — ACETAMINOPHEN 10 MG/ML IV SOLN
INTRAVENOUS | Status: DC | PRN
Start: 2015-02-13 — End: 2015-02-13
  Administered 2015-02-13: 1000 mg via INTRAVENOUS

## 2015-02-13 MED ORDER — PHENYLEPHRINE HCL 10 MG/ML IJ SOLN
INTRAMUSCULAR | Status: DC | PRN
  Administered 2015-02-13: 120 ug via INTRAVENOUS

## 2015-02-13 MED ORDER — PHENYLEPHRINE HCL 10 MG/ML IJ SOLN
10.0000 mg | INTRAMUSCULAR | Status: DC | PRN
  Administered 2015-02-13: 25 ug/min via INTRAVENOUS

## 2015-02-13 MED ORDER — SUCCINYLCHOLINE CHLORIDE 20 MG/ML IJ SOLN
INTRAMUSCULAR | Status: DC | PRN
  Administered 2015-02-13: 100 mg via INTRAVENOUS

## 2015-02-13 MED ORDER — STERILE WATER FOR IRRIGATION IR SOLN
Status: DC | PRN
  Administered 2015-02-13: 3500 mL

## 2015-02-13 MED ORDER — CIPROFLOXACIN HCL 500 MG PO TABS
500.0000 mg | ORAL_TABLET | Freq: Two times a day (BID) | ORAL | Status: DC
Start: 2015-02-13 — End: 2015-02-25

## 2015-02-13 MED ORDER — FENTANYL CITRATE 0.05 MG/ML IJ SOLN
INTRAMUSCULAR | Status: AC
  Filled 2015-02-13: qty 4

## 2015-02-13 MED ORDER — PROMETHAZINE HCL 25 MG/ML IJ SOLN
6.2500 mg | INTRAMUSCULAR | Status: DC | PRN
Start: 2015-02-13 — End: 2015-02-14
  Filled 2015-02-13: qty 1

## 2015-02-13 MED ORDER — HYDROMORPHONE HCL 1 MG/ML IJ SOLN
INTRAMUSCULAR | Status: AC
  Filled 2015-02-13: qty 1

## 2015-02-13 MED ORDER — ONDANSETRON HCL 4 MG/2ML IJ SOLN: INTRAMUSCULAR | Status: DC | PRN

## 2015-02-13 MED ORDER — BACITRACIN-NEOMYCIN-POLYMYXIN 400-5-5000 EX OINT
TOPICAL_OINTMENT | CUTANEOUS | Status: DC | PRN
  Administered 2015-02-13: 1 via TOPICAL

## 2015-02-13 MED ORDER — ONDANSETRON HCL 4 MG/2ML IJ SOLN
INTRAMUSCULAR | Status: DC | PRN
  Administered 2015-02-13: 4 mg via INTRAVENOUS

## 2015-02-13 MED ORDER — KETOROLAC TROMETHAMINE 30 MG/ML IJ SOLN
INTRAMUSCULAR | Status: DC | PRN
  Administered 2015-02-13: 30 mg via INTRAVENOUS

## 2015-02-13 MED ORDER — LACTATED RINGERS IV SOLN
INTRAVENOUS | Status: DC | PRN
  Administered 2015-02-13 (×2): via INTRAVENOUS

## 2015-02-13 MED ORDER — HYDROMORPHONE HCL 1 MG/ML IJ SOLN
0.2500 mg | INTRAMUSCULAR | Status: DC | PRN
  Administered 2015-02-13: 0.2 mg via INTRAVENOUS
  Filled 2015-02-13: qty 1

## 2015-02-13 MED ORDER — OXYCODONE HCL 5 MG PO TABS
5.0000 mg | ORAL_TABLET | Freq: Once | ORAL | Status: AC
  Administered 2015-02-13: 5 mg via ORAL
  Filled 2015-02-13: qty 1

## 2015-02-13 MED ORDER — SODIUM CHLORIDE 0.9 % IR SOLN
Status: DC | PRN
Start: 2015-02-13 — End: 2015-02-13
  Administered 2015-02-13: 3000 mL

## 2015-02-13 MED ORDER — CEFAZOLIN SODIUM 1-5 GM-% IV SOLN
INTRAVENOUS | Status: AC
  Filled 2015-02-13: qty 50

## 2015-02-13 MED ORDER — OXYBUTYNIN CHLORIDE 5 MG PO TABS
10.0000 mg | ORAL_TABLET | Freq: Once | ORAL | Status: DC
  Filled 2015-02-13: qty 2

## 2015-02-13 MED ORDER — BELLADONNA ALKALOIDS-OPIUM 16.2-60 MG RE SUPP
RECTAL | Status: AC
  Filled 2015-02-13: qty 1

## 2015-02-13 MED ORDER — CEFAZOLIN SODIUM-DEXTROSE 2-3 GM-% IV SOLR
2.0000 g | INTRAVENOUS | Status: AC
  Administered 2015-02-13: 2 g via INTRAVENOUS
  Filled 2015-02-13: qty 50

## 2015-02-13 MED ORDER — OXYCODONE HCL 5 MG PO TABS
ORAL_TABLET | ORAL | Status: AC
  Filled 2015-02-13: qty 1

## 2015-02-13 MED ORDER — PROPOFOL 10 MG/ML IV BOLUS
INTRAVENOUS | Status: DC | PRN
  Administered 2015-02-13: 50 mg via INTRAVENOUS
  Administered 2015-02-13: 200 mg via INTRAVENOUS
  Administered 2015-02-13: 50 mg via INTRAVENOUS

## 2015-02-13 MED ORDER — FENTANYL CITRATE 0.05 MG/ML IJ SOLN
INTRAMUSCULAR | Status: DC | PRN
  Administered 2015-02-13 (×2): 25 ug via INTRAVENOUS
  Administered 2015-02-13: 75 ug via INTRAVENOUS
  Administered 2015-02-13: 25 ug via INTRAVENOUS

## 2015-02-13 MED ORDER — MIDAZOLAM HCL 5 MG/5ML IJ SOLN
INTRAMUSCULAR | Status: DC | PRN
  Administered 2015-02-13: 2 mg via INTRAVENOUS

## 2015-02-13 SURGICAL SUPPLY — 52 items
BAG DRAIN URO-CYSTO SKYTR STRL (DRAIN) ×3 IMPLANT
BAG DRN UROCATH (DRAIN) ×2
BAG URINE DRAINAGE (UROLOGICAL SUPPLIES) ×1 IMPLANT
BLADE CLIPPER SURG (BLADE) ×3 IMPLANT
BLADE SURG 10 STRL SS (BLADE) ×1 IMPLANT
BLADE SURG 15 STRL LF DISP TIS (BLADE) ×2 IMPLANT
BLADE SURG 15 STRL SS (BLADE) ×3
BOOTIES KNEE HIGH SLOAN (MISCELLANEOUS) ×3 IMPLANT
CANISTER SUCTION 2500CC (MISCELLANEOUS) ×1 IMPLANT
CATH FOLEY 2WAY SLVR  5CC 14FR (CATHETERS) ×1
CATH FOLEY 2WAY SLVR  5CC 16FR (CATHETERS) ×1
CATH FOLEY 2WAY SLVR  5CC 18FR (CATHETERS) ×1
CATH FOLEY 2WAY SLVR 5CC 14FR (CATHETERS) IMPLANT
CATH FOLEY 2WAY SLVR 5CC 16FR (CATHETERS) IMPLANT
CATH FOLEY 2WAY SLVR 5CC 18FR (CATHETERS) IMPLANT
CATH FOLEY INTRO SUPRA 16F (CATHETERS) ×1 IMPLANT
CHARGE TECH PROCEDURE ONCURA (LABOR (TRAVEL & OVERTIME)) ×3 IMPLANT
CLOTH BEACON ORANGE TIMEOUT ST (SAFETY) ×3 IMPLANT
COVER MAYO STAND STRL (DRAPES) ×3 IMPLANT
COVER TABLE BACK 60X90 (DRAPES) ×3 IMPLANT
DRAPE CAMERA CLOSED 9X96 (DRAPES) ×3 IMPLANT
DRAPE INCISE IOBAN 66X45 STRL (DRAPES) ×3 IMPLANT
DRSG TEGADERM 4X4.75 (GAUZE/BANDAGES/DRESSINGS) ×4 IMPLANT
DRSG TEGADERM 8X12 (GAUZE/BANDAGES/DRESSINGS) ×1 IMPLANT
ELECT REM PT RETURN 9FT ADLT (ELECTROSURGICAL) ×3
ELECTRODE REM PT RTRN 9FT ADLT (ELECTROSURGICAL) ×2 IMPLANT
GAS ARGON HIGH PRESSURE (MEDICAL GASES) ×3 IMPLANT
GAS HELIUM HIGH PRESSURE (MEDICAL GASES) ×1 IMPLANT
GAUZE SPONGE 4X4 12PLY STRL (GAUZE/BANDAGES/DRESSINGS) ×1 IMPLANT
GLOVE BIO SURGEON STRL SZ7.5 (GLOVE) ×4 IMPLANT
GLOVE BIOGEL PI IND STRL 7.5 (GLOVE) IMPLANT
GLOVE BIOGEL PI INDICATOR 7.5 (GLOVE) ×1
GLOVE SURG SS PI 7.5 STRL IVOR (GLOVE) ×1 IMPLANT
GOWN STRL REUS W/ TWL XL LVL3 (GOWN DISPOSABLE) IMPLANT
GOWN STRL REUS W/TWL XL LVL3 (GOWN DISPOSABLE) ×9
GUIDEWIRE SUPER STIFF (WIRE) ×3 IMPLANT
HOLDER FOLEY CATH W/STRAP (MISCELLANEOUS) ×3 IMPLANT
IV NS IRRIG 3000ML ARTHROMATIC (IV SOLUTION) ×1 IMPLANT
KIT CRYO ENDOCARE (DISPOSABLE) ×1 IMPLANT
NDL SPNL 22GX7 QUINCKE BK (NEEDLE) IMPLANT
NEEDLE SPNL 22GX7 QUINCKE BK (NEEDLE) ×3 IMPLANT
NS IRRIG 500ML POUR BTL (IV SOLUTION) ×1 IMPLANT
PACK CYSTO (CUSTOM PROCEDURE TRAY) ×3 IMPLANT
PLUG CATH AND CAP STER (CATHETERS) ×2 IMPLANT
SET BERKELEY SUCTION TUBING (SUCTIONS) ×1 IMPLANT
SPONGE GAUZE 4X4 12PLY STER LF (GAUZE/BANDAGES/DRESSINGS) ×2 IMPLANT
SUT ETHILON 4 0 PS 2 18 (SUTURE) ×1 IMPLANT
SYR 50ML LL SCALE MARK (SYRINGE) ×1 IMPLANT
SYRINGE 10CC LL (SYRINGE) ×1 IMPLANT
UNDERPAD 30X30 INCONTINENT (UNDERPADS AND DIAPERS) ×4 IMPLANT
WATER STERILE IRR 3000ML UROMA (IV SOLUTION) ×3 IMPLANT
YANKAUER SUCT BULB TIP NO VENT (SUCTIONS) ×1 IMPLANT

## 2015-02-13 NOTE — Anesthesia Preprocedure Evaluation (Addendum)
Anesthesia Evaluation  Patient identified by MRN, date of birth, ID band Patient awake    Reviewed: Allergy & Precautions, NPO status , Patient's Chart, lab work & pertinent test results  Airway Mallampati: III  TM Distance: >3 FB Neck ROM: Full    Dental  (+) Teeth Intact, Dental Advisory Given, Caps,    Pulmonary sleep apnea and Continuous Positive Airway Pressure Ventilation , former smoker,  breath sounds clear to auscultation  Pulmonary exam normal       Cardiovascular hypertension, Pt. on medications and Pt. on home beta blockers + CAD and + Peripheral Vascular Disease Rhythm:Regular Rate:Normal     Neuro/Psych  Neuromuscular disease    GI/Hepatic GERD-  Medicated,  Endo/Other  diabetes, Type 2, Insulin Dependent, Oral Hypoglycemic AgentsHypothyroidism Morbid obesity  Renal/GU      Musculoskeletal  (+) Arthritis -, Osteoarthritis,    Abdominal (+) + obese,   Peds  Hematology   Anesthesia Other Findings   Reproductive/Obstetrics                         Anesthesia Physical Anesthesia Plan  ASA: III  Anesthesia Plan: General   Post-op Pain Management:    Induction: Intravenous  Airway Management Planned: Oral ETT  Additional Equipment:   Intra-op Plan:   Post-operative Plan: Extubation in OR  Informed Consent: I have reviewed the patients History and Physical, chart, labs and discussed the procedure including the risks, benefits and alternatives for the proposed anesthesia with the patient or authorized representative who has indicated his/her understanding and acceptance.   Dental advisory given  Plan Discussed with: CRNA and Surgeon  Anesthesia Plan Comments:         Anesthesia Quick Evaluation

## 2015-02-13 NOTE — Op Note (Signed)
Preoperative Diagnosis: intermediate risk prostate cancer, urinary retention  Postoperative Diagnosis:  same  Procedure(s) Performed:  1. Primary cryotherapy of the prostate. 2. Cystourethroscopy. 3. Insertion of suprapubic tube.   Teaching Surgeon:  Carolan Clines, MD  Resident Surgeon:  Marta Antu, MD  Assistant(s):  none  Anesthesia:  General via endotracheal tube  Fluids:  See anesthesia record  UOP: unable to obtain  Estimated blood loss:  5 mL  Specimens:  none  Drains:  14Fr suprapubic tube. 16Fr urethral foley, capped.  Complications:  none  Indications: 73yM with multiple medical comorbidities and absent erectile function with intermediate risk prostate cancer who, after discussion of risks/benefits/alternative therapies, elected for the above procedure.  Findings:   1. Large pannus made placement of suprapubic tube difficult, but with Rutner set, this was successful. 2. No probes in urethra. 3. Good freeze by ultrasound x 2.  Description:  The patient was correctly identified in the preop holding area where written informed consent as well potential risk and complication reviewed. He agreed. They were brought back to the operative suite where a preinduction timeout was performed. Once correct information was verified, general anesthesia was induced via endotracheal tube. SCDs were placed for VTE prophylaxis. The following prophylactic perioperative antibiotics were instilled: cipro.  He was placed in dorsal lithotomy position and his genitalia and suprapubic region were prepped and he was draped in sterile fashion.  With sterile saline running, a flexible cystoscope was advanced per urethra into the bladder. This showed bilobar enlargement, and clean mucosa in the bladder with trabeculations. A spinal needle was placed from above into the anterior wall of the bladder. A 1cm incision was made in the skin in the suprapubic region, and a 14Fr Rutner  suprapubic tube introducer was advanced into the bladder under direct vision. The patient had been placed in trendelenburg position. The suprapubic tube was sutured to the skin.  The scrotum was taped to the anterior abdominal wall to retract the testicles out of the cryoprobe path.  A rectal U/S probe was placed and the cryoprobe grid was assembled.  The prostate volume was calculated at 20g.  6 x 13g cryoprobe needles were inserted through the perineum and positioned with their distal ends near the bladder neck.  Two thermocouples were placed in the external urethral sphincter and denonveillers.   Urethroscopy confirmed no needles in the urethra.  An amplatz wire was left in place and the scope was removed. The urethral warmer was placed over the wire and position was confirmed sonographically.  Two freeze-thaw cycles were performed with excellent ice ball formation and confluence sonographically, and -70 reached on the temperature probes.   The urethral warmer was left in place for an additional 43minutes after the probes had been removed.  A foley catheter was replaced.  Dressing was applied, the patient was taken out of trendelenburg, extubated and transported to the recovery room having tolerated the procedure well.     Post Op Plan:   1. Discharge patient home when meets PACU criteria. 2. Return to office in 2 days for foley catheter removal.  Attestation:  Dr. Gaynelle Arabian was present and scrubbed for the entirety of the procedure.    [name] Renault "Jed" Glo Herring, MD Resident, Department of Urology

## 2015-02-13 NOTE — Anesthesia Postprocedure Evaluation (Signed)
  Anesthesia Post-op Note  Patient: [name] [name]  Procedure(s) Performed: Procedure(s) (LRB): CYSTOSCOPY (N/A) CRYO ABLATION PROSTATE (N/A) INSERTION OF SUPRAPUBIC CATHETER (N/A)  Patient Location: PACU  Anesthesia Type: General  Level of Consciousness: awake and alert   Airway and Oxygen Therapy: Patient Spontanous Breathing  Post-op Pain: mild  Post-op Assessment: Post-op Vital signs reviewed, Patient's Cardiovascular Status Stable, Respiratory Function Stable, Patent Airway and No signs of Nausea or vomiting  Last Vitals:  Filed Vitals:   02/13/15 1400  BP: 157/59  Pulse: 79  Temp:   Resp: 14    Post-op Vital Signs: stable   Complications: No apparent anesthesia complications

## 2015-02-13 NOTE — H&P (Signed)
Reason For Visit Consult after prostate biopsy   Active Problems Problems  1. Prostate cancer (C61)  History of Present Illness     74 yo male returns today for a consult to review pathology after prostate biopsy on 12/28/14 for hx of an elevated PSA. Originally referred by Dr. Maudie Mercury for further evaluation of an elevated PSA of 5.04 on 10/11/14. Patient has also been on HRT for hypogonadism. He is a diabetic- not insulin dependent- since bout of pancreatitis in 2009. IPSS=23., with severe intermittency, urgency, weak stream, and nocturia.    Mother had Ca breast.     11/22/14 labs: PSA - 5.05/12% and Testosterone - 549  09/20/14 Testosterone - 292      Past Medical History Problems  1. History of Anxiety (F41.9) 2. History of arthritis (Z87.39) 3. History of diabetes mellitus (Z86.39) 4. History of esophageal reflux (Z87.19) 5. History of hypercholesterolemia (Z86.39) 6. History of hypertension (Z86.79) 7. History of hypothyroidism (Z86.39) 8. History of sleep apnea (Z87.09) 9. History of stomach ulcers (Z87.19) 10. History of Pancreatic pseudocyst (K86.3)  Surgical History Problems  1. History of Breast Surgery Lumpectomy 2. History of Hand Surgery 3. History of Hernia Repair 4. History of Knee Replacement 5. History of Knee Surgery 6. History of Shoulder Surgery  Current Meds 1. AmLODIPine Besylate 10 MG Oral Tablet;  Therapy: (Recorded:24Nov2015) to Recorded 2. Apidra SOLN;  Therapy: (Recorded:24Nov2015) to Recorded 3. BiDil 20-37.5 MG Oral Tablet;  Therapy: (Recorded:24Nov2015) to Recorded 4. CloNIDine HCl - 0.1 MG Oral Tablet;  Therapy: (Recorded:24Nov2015) to Recorded 5. Lantus SOLN;  Therapy: (Recorded:24Nov2015) to Recorded 6. Levothyroxine Sodium TABS;  Therapy: (Recorded:24Nov2015) to Recorded 7. Metoprolol Tartrate TABS;  Therapy: (Recorded:24Nov2015) to Recorded 8. Ramipril 10 MG Oral Capsule;  Therapy: (Recorded:24Nov2015) to Recorded 9.  Simvastatin 80 MG Oral Tablet;  Therapy: (Recorded:24Nov2015) to Recorded 10. Tamsulosin HCl - 0.4 MG Oral Capsule; TAKE 1 CAPSULE Bedtime;   Therapy: [id number] to (Last Rx:24Nov2015)  Requested for: [id number]   Ordered 11. Tamsulosin HCl - 0.4 MG Oral Capsule;   Therapy: (Recorded:24Nov2015) to Recorded 12. Torsemide TABS;   Therapy: (Recorded:24Nov2015) to Recorded  Allergies Medication  1. No Known Drug Allergies  Family History Problems  1. Family history of malignant neoplasm (Z80.9) : Mother 2. Family history of Heart trouble : Father, Brother  Social History Problems    Denied: History of Alcohol use   Caffeine use (F15.90)   Former smoker (Z87.891)   Married   Retired   Three children  Review of Systems Genitourinary, constitutional, skin, eye, otolaryngeal, hematologic/lymphatic, cardiovascular, pulmonary, endocrine, musculoskeletal, gastrointestinal, neurological and psychiatric system(s) were reviewed and pertinent findings if present are noted and are otherwise negative.  Genitourinary: urinary frequency, feelings of urinary urgency, nocturia, incontinence, urinary stream starts and stops and erectile dysfunction.  Gastrointestinal: heartburn, diarrhea and constipation.  Constitutional: feeling tired (fatigue).  Eyes: blurred vision.  Hematologic/Lymphatic: a tendency to easily bruise.  Cardiovascular: leg swelling.  Respiratory: shortness of breath.  Musculoskeletal: joint pain.    Vitals Vital Signs [Data Includes: Last 1 Day]  Recorded: [id number] 04:31PM  Blood Pressure: 181 / 84 Temperature: 98.1 F Heart Rate: 94  Physical Exam Constitutional: Well nourished and well developed . No acute distress.  ENT:. The ears and nose are normal in appearance.  Neck: The appearance of the neck is normal and no neck mass is present.  Pulmonary: No respiratory distress and normal respiratory rhythm and effort . Breathing requires use of accessory muscles.  Cardiovascular: Heart rate and rhythm are normal . No peripheral edema.  Abdomen: The abdomen is soft and nontender. No masses are palpated. The abdomen is normal to percussion. Bowel sounds are normal. No hernias are palpable.  Rectal: Rectal exam demonstrates decreased sphincter tone. Estimated prostate size is 3+. The prostate has a palpable nodule involving the entire right lobe  of the prostate which appears to be confined within the prostate capsule.  Genitourinary: Examination of the penis demonstrates no discharge, no masses, no lesions and a normal meatus. The penis is circumcised. The scrotum is without lesions. The right epididymis is palpably normal and non-tender. The left epididymis is palpably normal and non-tender. The right testis is non-tender and without masses. The left testis is non-tender and without masses.  Lymphatics: The femoral and inguinal nodes are not enlarged or tender.  Skin: Normal skin turgor, no visible rash and no visible skin lesions.  Neuro/Psych:. Mood and affect are appropriate.    Assessment Assessed  1. Prostate cancer (C61) 2. PSA elevation (R97.2) 3. Weak urinary stream (R39.12) 4. Hypogonadism, testicular (E29.1) 5. Nocturia (R35.1) 6. 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 In addition, he has and additional area of G 3+4 CaP in the L base. 7/ 12  Biopsies are + for  prostate cancer, with involvement of 90%-95% of the biopsies.    We have reviewed the Partin nomogam, and note that he has 50% chance of organ-confined disease, >32% chance of extra-capsular disease; with only 5% chance of SV disease or lymph node disease.    Influencing medical problems are his diabetes, his obesity ( wt: 310, 5\' 8" , waist size: 52"), and his  hypogonadism ( he will stop his testosterone injections); L knee replacement; and his severe bladder outlet obstruction ( International Prostate Symptom Score of 23-max is 35 and normal is 7-10).    We have discussed "no " therapy, delayed hormone therapy, and watchful-waiting therapy, and none of these treatment options seem to fit Mr Barner's lifestyle or treatment options. We have discussed options for Rx, including surgery for removal of the prostate, which entail GET anesthesia, and with risk of bleeding and incontinence-but would offer removal of the tumor and the capsule-but with significant risk of recurrence.   He could have Radiation therapy; however, he is not a good candidate for I-125 seed Rx because of the volume of his disease, and the high Gleason score. He would be a candidate for IMRT if we could get his IPSS down to < 14, to try to avoid post Radiation urinary retention.    Cryotherapy/s-p tube offers him outpatient surgery, with LMA anesthesia, with good control of his cancer. I do not think it offers him as good a "cure" as either LRRP or Radiation Therapy, but would give him excellent treatment of his cancer, and allow him to void via s-p tube until he is voiding normally again. He already has erectile dysfunction ( " no erections in 10 years".). He and his wife are in favor of cryotherapy, and will begin pt on flomax/finasteride; stop his T injections; and schedule his cryotherapy.   Plan Bladder outlet obstruction  1. Start: Finasteride 5 MG Oral Tablet; TAKE ONE TABLET BY MOUTH  DAILY 2. Start: Tamsulosin HCl - 0.4 MG Oral  Capsule; TAKE 1 CAPSULE Bedtime  1. Stop Testosterone injections  2. Flomax 0.4mg /day  3. Finasteride 5mg /day  4. Schedule cysto, s-p tube placement and cryotherapy   Discussion/Summary cc: Dr. Wilson Singer   cc: Jani Gravel, MD   A total of 70 minutes were spent in the overall care of the patient today1  with 70 minutes in direct face to face  consultation.1      1 Amended By: Carolan Clines; Jan 09 2015 5:57 PM EST  Signatures Electronically signed by : Carolan Clines, M.D.; Jan 09 2015  5:56PM EST Electronically signed by : Carolan Clines, M.D.; Jan 09 2015  5:57PM EST

## 2015-02-13 NOTE — Transfer of Care (Signed)
Immediate Anesthesia Transfer of Care Note  Patient: [name] [name]  Procedure(s) Performed: Procedure(s) (LRB): CYSTOSCOPY (N/A) CRYO ABLATION PROSTATE (N/A) INSERTION OF SUPRAPUBIC CATHETER (N/A)  Patient Location: PACU  Anesthesia Type: General  Level of Consciousness: awake, sedated, patient cooperative and responds to stimulation  Airway & Oxygen Therapy: Patient Spontanous Breathing and Patient connected to face mask oxygen  Post-op Assessment: Report given to PACU RN, Post -op Vital signs reviewed and stable and Patient moving all extremities  Post vital signs: Reviewed and stable  Complications: No apparent anesthesia complications

## 2015-02-13 NOTE — Anesthesia Procedure Notes (Signed)
Procedure Name: Intubation Date/Time: 02/13/2015 11:09 AM Performed by: Wanita Chamberlain Pre-anesthesia Checklist: Patient identified, Timeout performed, Emergency Drugs available, Suction available and Patient being monitored Patient Re-evaluated:Patient Re-evaluated prior to inductionOxygen Delivery Method: Circle system utilized Preoxygenation: Pre-oxygenation with 100% oxygen Intubation Type: IV induction Ventilation: Mask ventilation without difficulty Laryngoscope Size: Mac and 4 Grade View: Grade II Tube type: Oral Tube size: 8.0 mm Number of attempts: 1 Airway Equipment and Method: Stylet and Bite block Placement Confirmation: positive ETCO2,  ETT inserted through vocal cords under direct vision and breath sounds checked- equal and bilateral Secured at: 22 cm Tube secured with: Tape Dental Injury: Teeth and Oropharynx as per pre-operative assessment  Comments: Grade ll view with cricoid pressure

## 2015-02-13 NOTE — Discharge Instructions (Signed)
INSTRUCTIONS AFTER CRYOABLATION OF THE PROSTATE  Normal Findings After Cryoablation:   You may experience a number of symptoms after the cryoablation which occur in  some patients.  There is no cause for alarm should this happen.  These symptoms include:  -Blood in the urine.  When you are discharged after your surgery, your urine will have some blood in it and may appear red.  This occurs to some degree in all patients after cryoablation and is not cause for alarm.  Your urine should clear approximately 24 hours after the procedure, but may persist for quite a while longer.  -Scrotal and penile swelling and bruising.  This occurs about 2-3 days after the cryoablation and is caused by tissue swelling which temporarily blocks the drainage of lymph.  This is painless and resolves in less than one week.  Ice packs and lying down for short periods of time during the day will improve the swelling.  -Small amounts of bloody discharge from the end of your penis.  This can  occur for up to 6 weeks after the procedure and is not cause for alarm.  It is due  to some discharge from the urethra in the area of the prostate.  -Some numbness in the head of the penis.  Occasionally, when a large amount of freezing has been performed during the procedure, the nerve which supplies sensation to one or both sides of the head of the penis may be affected. The sensation returns after a number of months.  Call your doctor at [phone] if any of the following occur:               -If you have any pain, fever, or chills.  -If your foley catheter or suprapubic tube is not draining urine.  -If there is decreasing urinary stream.  This may indicate sloughing of some  dead tissue in the area of the prostate near the opening to the bladder.  It may clear on its own but,  if the problem becomes severe, it may require removal of the dead tissue through a cystoscope.  -If you have diarrhea after urination or foul-smelling urine.   These  symptoms may indicate an urethrorectal fistula, which is a hole between the bladder channel and the rectum.  This should be investigated by your doctor.  -If you have any questions or problems.   Diet:  Resume your normal diet.   If you become constipated, you may try over-the-counter remedies such as Milk of Magnesia.  If you are nauseated, vomiting or feel bloated, notify your doctor's office.  DO NOT GIVE YOURSELF ANY ENEMAS OR RECTAL MEDICATIONS.  THE RECTAL WALL IS THIN AFTER CRYOABLATION.  Activity:   -You may have swelling and bruising of the penis and scrotum.  Apply ice packs   to the area behind the scrotum intermittently for 24-48 hours after your surgery to keep the swelling down.  Wearing an athletic supporter (jock strap) might also help.  -Lying flat on your back will also help decrease the swelling.  You may find   when you are sitting up or walking around that the swelling increases.   -You will have  puncture wounds behind your scrotum making it painful to sit   down.  Use a hemorrhoid donut to sit on if needed.  -You may shower on the second day after surgery.  -There are no lifting or driving restrictions.  Use caution while the suprapubic tube  is in place.  Medications:  -You may resume your preoperative medications, except aspirin and other blood   thinning agents.  Unless otherwise instructed, resume your aspirin after your suprapubic tube is removed.  If you are taking Coumadin or other blood thinners, please discuss when to restart these medications with your surgeon.  -Unless otherwise ordered, you will be given prescriptions for the following   medications which you will need to take after your procedure:   *Antibiotics -  to prevent infection while your suprapubic tube is in place.   *Anti-inflammatory - to prevent pain and to decrease the inflammation in  the area of the prostate.  You may take ibuprofen (Motrin, Advil), acetaminophen (Tylenol) or naproxen  (Aleve) as needed for discomfort.   Call your doctor's office at [phone] for an appointment in    weeks.  Patient Signature:  ________________________________________________________  Nurse's Signature:  ________________________________________________________    Post Anesthesia Home Care Instructions  Activity: Get plenty of rest for the remainder of the day. A responsible adult should stay with you for 24 hours following the procedure.  For the next 24 hours, DO NOT: -Drive a car -Paediatric nurse -Drink alcoholic beverages -Take any medication unless instructed by your physician -Make any legal decisions or sign important papers.  Meals: Start with liquid foods such as gelatin or soup. Progress to regular foods as tolerated. Avoid greasy, spicy, heavy foods. If nausea and/or vomiting occur, drink only clear liquids until the nausea and/or vomiting subsides. Call your physician if vomiting continues.  Special Instructions/Symptoms: Your throat may feel dry or sore from the anesthesia or the breathing tube placed in your throat during surgery. If this causes discomfort, gargle with warm salt water. The discomfort should disappear within 24 hours.

## 2015-02-14 ENCOUNTER — Encounter (HOSPITAL_BASED_OUTPATIENT_CLINIC_OR_DEPARTMENT_OTHER): Payer: Self-pay | Admitting: Urology

## 2015-02-23 ENCOUNTER — Other Ambulatory Visit: Payer: Self-pay | Admitting: Physician Assistant

## 2015-02-25 ENCOUNTER — Emergency Department (HOSPITAL_BASED_OUTPATIENT_CLINIC_OR_DEPARTMENT_OTHER)
Admission: EM | Admit: 2015-02-25 | Discharge: 2015-02-25 | Disposition: A | Discharge status: Home / Self Care | Payer: Medicare Other | Attending: Emergency Medicine | Admitting: Emergency Medicine

## 2015-02-25 ENCOUNTER — Encounter (HOSPITAL_BASED_OUTPATIENT_CLINIC_OR_DEPARTMENT_OTHER): Payer: Self-pay | Admitting: *Deleted

## 2015-02-25 DIAGNOSIS — Z79899 Other long term (current) drug therapy: Secondary | ICD-10-CM | POA: Diagnosis not present

## 2015-02-25 DIAGNOSIS — Z9981 Dependence on supplemental oxygen: Secondary | ICD-10-CM | POA: Insufficient documentation

## 2015-02-25 DIAGNOSIS — E039 Hypothyroidism, unspecified: Secondary | ICD-10-CM | POA: Insufficient documentation

## 2015-02-25 DIAGNOSIS — Z872 Personal history of diseases of the skin and subcutaneous tissue: Secondary | ICD-10-CM | POA: Insufficient documentation

## 2015-02-25 DIAGNOSIS — E119 Type 2 diabetes mellitus without complications: Secondary | ICD-10-CM | POA: Insufficient documentation

## 2015-02-25 DIAGNOSIS — Z9889 Other specified postprocedural states: Secondary | ICD-10-CM | POA: Diagnosis not present

## 2015-02-25 DIAGNOSIS — Z8546 Personal history of malignant neoplasm of prostate: Secondary | ICD-10-CM | POA: Diagnosis not present

## 2015-02-25 DIAGNOSIS — R339 Retention of urine, unspecified: Secondary | ICD-10-CM | POA: Diagnosis present

## 2015-02-25 DIAGNOSIS — I2510 Atherosclerotic heart disease of native coronary artery without angina pectoris: Secondary | ICD-10-CM | POA: Diagnosis not present

## 2015-02-25 DIAGNOSIS — B9689 Other specified bacterial agents as the cause of diseases classified elsewhere: Secondary | ICD-10-CM | POA: Diagnosis not present

## 2015-02-25 DIAGNOSIS — K219 Gastro-esophageal reflux disease without esophagitis: Secondary | ICD-10-CM | POA: Insufficient documentation

## 2015-02-25 DIAGNOSIS — Z87891 Personal history of nicotine dependence: Secondary | ICD-10-CM | POA: Insufficient documentation

## 2015-02-25 DIAGNOSIS — N390 Urinary tract infection, site not specified: Secondary | ICD-10-CM | POA: Diagnosis not present

## 2015-02-25 DIAGNOSIS — M1990 Unspecified osteoarthritis, unspecified site: Secondary | ICD-10-CM | POA: Insufficient documentation

## 2015-02-25 DIAGNOSIS — Z791 Long term (current) use of non-steroidal anti-inflammatories (NSAID): Secondary | ICD-10-CM | POA: Diagnosis not present

## 2015-02-25 DIAGNOSIS — G4733 Obstructive sleep apnea (adult) (pediatric): Secondary | ICD-10-CM | POA: Diagnosis not present

## 2015-02-25 DIAGNOSIS — E785 Hyperlipidemia, unspecified: Secondary | ICD-10-CM | POA: Diagnosis not present

## 2015-02-25 DIAGNOSIS — I10 Essential (primary) hypertension: Secondary | ICD-10-CM | POA: Diagnosis not present

## 2015-02-25 DIAGNOSIS — Z87828 Personal history of other (healed) physical injury and trauma: Secondary | ICD-10-CM | POA: Diagnosis not present

## 2015-02-25 LAB — URINALYSIS, ROUTINE W REFLEX MICROSCOPIC
Bilirubin Urine: NEGATIVE
GLUCOSE, UA: NEGATIVE mg/dL
KETONES UR: NEGATIVE mg/dL
Nitrite: NEGATIVE
Protein, ur: 30 mg/dL — AB
SPECIFIC GRAVITY, URINE: 1.019 (ref 1.005–1.030)
Urobilinogen, UA: 0.2 mg/dL (ref 0.0–1.0)
pH: 5 (ref 5.0–8.0)

## 2015-02-25 LAB — URINE MICROSCOPIC-ADD ON

## 2015-02-25 MED ORDER — CIPROFLOXACIN HCL 500 MG PO TABS: 500.0000 mg | ORAL_TABLET | Freq: Two times a day (BID) | ORAL | Status: DC
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
## 2015-02-25 NOTE — Discharge Instructions (Signed)

## 2015-02-25 NOTE — ED Notes (Signed)
Patient states when he urinates that it burns, had procedure done recently for prostate cancer and has a suprapubic cath at this time.

## 2015-02-27 DIAGNOSIS — N3941 Urge incontinence: Secondary | ICD-10-CM | POA: Diagnosis not present

## 2015-02-28 LAB — URINE CULTURE
Colony Count: 40000
Special Requests: NORMAL

## 2015-03-02 DIAGNOSIS — N3946 Mixed incontinence: Secondary | ICD-10-CM | POA: Diagnosis not present

## 2015-03-02 DIAGNOSIS — M6281 Muscle weakness (generalized): Secondary | ICD-10-CM | POA: Diagnosis not present

## 2015-03-02 DIAGNOSIS — R278 Other lack of coordination: Secondary | ICD-10-CM | POA: Diagnosis not present

## 2015-03-06 DIAGNOSIS — M75121 Complete rotator cuff tear or rupture of right shoulder, not specified as traumatic: Secondary | ICD-10-CM | POA: Diagnosis not present

## 2015-03-06 DIAGNOSIS — M1712 Unilateral primary osteoarthritis, left knee: Secondary | ICD-10-CM | POA: Diagnosis not present

## 2015-03-10 DIAGNOSIS — N3946 Mixed incontinence: Secondary | ICD-10-CM | POA: Diagnosis not present

## 2015-03-10 DIAGNOSIS — R278 Other lack of coordination: Secondary | ICD-10-CM | POA: Diagnosis not present

## 2015-03-10 DIAGNOSIS — M6200 Separation of muscle (nontraumatic), unspecified site: Secondary | ICD-10-CM | POA: Diagnosis not present

## 2015-03-10 DIAGNOSIS — M6281 Muscle weakness (generalized): Secondary | ICD-10-CM | POA: Diagnosis not present

## 2015-03-21 DIAGNOSIS — M62838 Other muscle spasm: Secondary | ICD-10-CM | POA: Diagnosis not present

## 2015-03-21 DIAGNOSIS — M6281 Muscle weakness (generalized): Secondary | ICD-10-CM | POA: Diagnosis not present

## 2015-03-21 DIAGNOSIS — N3946 Mixed incontinence: Secondary | ICD-10-CM | POA: Diagnosis not present

## 2015-03-21 DIAGNOSIS — R278 Other lack of coordination: Secondary | ICD-10-CM | POA: Diagnosis not present

## 2015-03-29 DIAGNOSIS — N3946 Mixed incontinence: Secondary | ICD-10-CM | POA: Diagnosis not present

## 2015-03-29 DIAGNOSIS — M6281 Muscle weakness (generalized): Secondary | ICD-10-CM | POA: Diagnosis not present

## 2015-03-29 DIAGNOSIS — M62838 Other muscle spasm: Secondary | ICD-10-CM | POA: Diagnosis not present

## 2015-03-29 DIAGNOSIS — R278 Other lack of coordination: Secondary | ICD-10-CM | POA: Diagnosis not present

## 2015-03-30 DIAGNOSIS — M65332 Trigger finger, left middle finger: Secondary | ICD-10-CM | POA: Diagnosis not present

## 2015-04-04 DIAGNOSIS — C61 Malignant neoplasm of prostate: Secondary | ICD-10-CM | POA: Diagnosis not present

## 2015-04-04 DIAGNOSIS — R972 Elevated prostate specific antigen [PSA]: Secondary | ICD-10-CM | POA: Diagnosis not present

## 2015-04-11 DIAGNOSIS — C61 Malignant neoplasm of prostate: Secondary | ICD-10-CM | POA: Diagnosis not present

## 2015-04-12 DIAGNOSIS — R278 Other lack of coordination: Secondary | ICD-10-CM | POA: Diagnosis not present

## 2015-04-12 DIAGNOSIS — M62838 Other muscle spasm: Secondary | ICD-10-CM | POA: Diagnosis not present

## 2015-04-12 DIAGNOSIS — N3946 Mixed incontinence: Secondary | ICD-10-CM | POA: Diagnosis not present

## 2015-04-12 DIAGNOSIS — M6281 Muscle weakness (generalized): Secondary | ICD-10-CM | POA: Diagnosis not present

## 2015-04-13 DIAGNOSIS — L570 Actinic keratosis: Secondary | ICD-10-CM | POA: Diagnosis not present

## 2015-04-13 DIAGNOSIS — L812 Freckles: Secondary | ICD-10-CM | POA: Diagnosis not present

## 2015-04-13 DIAGNOSIS — L821 Other seborrheic keratosis: Secondary | ICD-10-CM | POA: Diagnosis not present

## 2015-04-14 DIAGNOSIS — E291 Testicular hypofunction: Secondary | ICD-10-CM | POA: Diagnosis not present

## 2015-04-14 DIAGNOSIS — E118 Type 2 diabetes mellitus with unspecified complications: Secondary | ICD-10-CM | POA: Diagnosis not present

## 2015-04-14 DIAGNOSIS — E785 Hyperlipidemia, unspecified: Secondary | ICD-10-CM | POA: Diagnosis not present

## 2015-04-14 DIAGNOSIS — E0789 Other specified disorders of thyroid: Secondary | ICD-10-CM | POA: Diagnosis not present

## 2015-04-14 DIAGNOSIS — E039 Hypothyroidism, unspecified: Secondary | ICD-10-CM | POA: Diagnosis not present

## 2015-04-19 DIAGNOSIS — R278 Other lack of coordination: Secondary | ICD-10-CM | POA: Diagnosis not present

## 2015-04-19 DIAGNOSIS — M62838 Other muscle spasm: Secondary | ICD-10-CM | POA: Diagnosis not present

## 2015-04-19 DIAGNOSIS — N3946 Mixed incontinence: Secondary | ICD-10-CM | POA: Diagnosis not present

## 2015-04-19 DIAGNOSIS — M6281 Muscle weakness (generalized): Secondary | ICD-10-CM | POA: Diagnosis not present

## 2015-04-19 DIAGNOSIS — M6200 Separation of muscle (nontraumatic), unspecified site: Secondary | ICD-10-CM | POA: Diagnosis not present

## 2015-04-20 DIAGNOSIS — C61 Malignant neoplasm of prostate: Secondary | ICD-10-CM | POA: Diagnosis not present

## 2015-04-20 DIAGNOSIS — E291 Testicular hypofunction: Secondary | ICD-10-CM | POA: Diagnosis not present

## 2015-04-20 DIAGNOSIS — E118 Type 2 diabetes mellitus with unspecified complications: Secondary | ICD-10-CM | POA: Diagnosis not present

## 2015-04-25 DIAGNOSIS — R600 Localized edema: Secondary | ICD-10-CM | POA: Diagnosis not present

## 2015-04-25 DIAGNOSIS — I10 Essential (primary) hypertension: Secondary | ICD-10-CM | POA: Diagnosis not present

## 2015-04-25 DIAGNOSIS — Z6841 Body Mass Index (BMI) 40.0 and over, adult: Secondary | ICD-10-CM | POA: Diagnosis not present

## 2015-05-02 DIAGNOSIS — M6281 Muscle weakness (generalized): Secondary | ICD-10-CM | POA: Diagnosis not present

## 2015-05-02 DIAGNOSIS — N3946 Mixed incontinence: Secondary | ICD-10-CM | POA: Diagnosis not present

## 2015-05-02 DIAGNOSIS — M62838 Other muscle spasm: Secondary | ICD-10-CM | POA: Diagnosis not present

## 2015-05-02 DIAGNOSIS — R278 Other lack of coordination: Secondary | ICD-10-CM | POA: Diagnosis not present

## 2015-05-04 ENCOUNTER — Encounter: Payer: Self-pay | Admitting: Gastroenterology

## 2015-05-10 DIAGNOSIS — M62838 Other muscle spasm: Secondary | ICD-10-CM | POA: Diagnosis not present

## 2015-05-10 DIAGNOSIS — M6281 Muscle weakness (generalized): Secondary | ICD-10-CM | POA: Diagnosis not present

## 2015-05-10 DIAGNOSIS — N3946 Mixed incontinence: Secondary | ICD-10-CM | POA: Diagnosis not present

## 2015-05-10 DIAGNOSIS — R278 Other lack of coordination: Secondary | ICD-10-CM | POA: Diagnosis not present

## 2015-05-22 DIAGNOSIS — M6281 Muscle weakness (generalized): Secondary | ICD-10-CM | POA: Diagnosis not present

## 2015-05-22 DIAGNOSIS — M62838 Other muscle spasm: Secondary | ICD-10-CM | POA: Diagnosis not present

## 2015-05-22 DIAGNOSIS — R278 Other lack of coordination: Secondary | ICD-10-CM | POA: Diagnosis not present

## 2015-05-22 DIAGNOSIS — N3946 Mixed incontinence: Secondary | ICD-10-CM | POA: Diagnosis not present

## 2015-06-05 DIAGNOSIS — R278 Other lack of coordination: Secondary | ICD-10-CM | POA: Diagnosis not present

## 2015-06-05 DIAGNOSIS — M62838 Other muscle spasm: Secondary | ICD-10-CM | POA: Diagnosis not present

## 2015-06-05 DIAGNOSIS — M6281 Muscle weakness (generalized): Secondary | ICD-10-CM | POA: Diagnosis not present

## 2015-06-05 DIAGNOSIS — M6200 Separation of muscle (nontraumatic), unspecified site: Secondary | ICD-10-CM | POA: Diagnosis not present

## 2015-06-26 ENCOUNTER — Other Ambulatory Visit: Payer: Self-pay

## 2015-06-26 DIAGNOSIS — N3946 Mixed incontinence: Secondary | ICD-10-CM | POA: Diagnosis not present

## 2015-06-26 DIAGNOSIS — R278 Other lack of coordination: Secondary | ICD-10-CM | POA: Diagnosis not present

## 2015-06-26 DIAGNOSIS — M62838 Other muscle spasm: Secondary | ICD-10-CM | POA: Diagnosis not present

## 2015-06-26 DIAGNOSIS — M6281 Muscle weakness (generalized): Secondary | ICD-10-CM | POA: Diagnosis not present

## 2015-07-05 DIAGNOSIS — R972 Elevated prostate specific antigen [PSA]: Secondary | ICD-10-CM | POA: Diagnosis not present

## 2015-07-05 DIAGNOSIS — C61 Malignant neoplasm of prostate: Secondary | ICD-10-CM | POA: Diagnosis not present

## 2015-07-12 DIAGNOSIS — C61 Malignant neoplasm of prostate: Secondary | ICD-10-CM | POA: Diagnosis not present

## 2015-07-12 DIAGNOSIS — R3912 Poor urinary stream: Secondary | ICD-10-CM | POA: Diagnosis not present

## 2015-07-12 DIAGNOSIS — N3941 Urge incontinence: Secondary | ICD-10-CM | POA: Diagnosis not present

## 2015-07-12 DIAGNOSIS — E291 Testicular hypofunction: Secondary | ICD-10-CM | POA: Diagnosis not present

## 2015-08-15 DIAGNOSIS — J029 Acute pharyngitis, unspecified: Secondary | ICD-10-CM | POA: Diagnosis not present

## 2015-08-17 DIAGNOSIS — H9202 Otalgia, left ear: Secondary | ICD-10-CM | POA: Diagnosis not present

## 2015-08-22 DIAGNOSIS — R070 Pain in throat: Secondary | ICD-10-CM | POA: Diagnosis not present

## 2015-08-22 DIAGNOSIS — H9202 Otalgia, left ear: Secondary | ICD-10-CM | POA: Diagnosis not present

## 2015-09-28 DIAGNOSIS — M65332 Trigger finger, left middle finger: Secondary | ICD-10-CM | POA: Diagnosis not present

## 2015-10-03 DIAGNOSIS — E119 Type 2 diabetes mellitus without complications: Secondary | ICD-10-CM | POA: Diagnosis not present

## 2015-10-03 DIAGNOSIS — Z23 Encounter for immunization: Secondary | ICD-10-CM | POA: Diagnosis not present

## 2015-10-03 DIAGNOSIS — M25512 Pain in left shoulder: Secondary | ICD-10-CM | POA: Diagnosis not present

## 2015-10-03 DIAGNOSIS — I10 Essential (primary) hypertension: Secondary | ICD-10-CM | POA: Diagnosis not present

## 2015-10-03 DIAGNOSIS — E039 Hypothyroidism, unspecified: Secondary | ICD-10-CM | POA: Diagnosis not present

## 2015-10-12 DIAGNOSIS — C61 Malignant neoplasm of prostate: Secondary | ICD-10-CM | POA: Diagnosis not present

## 2015-10-12 DIAGNOSIS — E118 Type 2 diabetes mellitus with unspecified complications: Secondary | ICD-10-CM | POA: Diagnosis not present

## 2015-10-12 DIAGNOSIS — E291 Testicular hypofunction: Secondary | ICD-10-CM | POA: Diagnosis not present

## 2015-10-12 DIAGNOSIS — E119 Type 2 diabetes mellitus without complications: Secondary | ICD-10-CM | POA: Diagnosis not present

## 2015-10-12 DIAGNOSIS — I10 Essential (primary) hypertension: Secondary | ICD-10-CM | POA: Diagnosis not present

## 2015-10-23 DIAGNOSIS — M7542 Impingement syndrome of left shoulder: Secondary | ICD-10-CM | POA: Diagnosis not present

## 2015-10-23 DIAGNOSIS — M47812 Spondylosis without myelopathy or radiculopathy, cervical region: Secondary | ICD-10-CM | POA: Diagnosis not present

## 2015-10-23 DIAGNOSIS — M542 Cervicalgia: Secondary | ICD-10-CM | POA: Diagnosis not present

## 2015-10-24 DIAGNOSIS — E119 Type 2 diabetes mellitus without complications: Secondary | ICD-10-CM | POA: Diagnosis not present

## 2015-10-24 DIAGNOSIS — I10 Essential (primary) hypertension: Secondary | ICD-10-CM | POA: Diagnosis not present

## 2015-10-24 DIAGNOSIS — E039 Hypothyroidism, unspecified: Secondary | ICD-10-CM | POA: Diagnosis not present

## 2015-10-24 DIAGNOSIS — E291 Testicular hypofunction: Secondary | ICD-10-CM | POA: Diagnosis not present

## 2015-12-04 DIAGNOSIS — E119 Type 2 diabetes mellitus without complications: Secondary | ICD-10-CM | POA: Diagnosis not present

## 2015-12-07 DIAGNOSIS — I10 Essential (primary) hypertension: Secondary | ICD-10-CM | POA: Diagnosis not present

## 2015-12-07 DIAGNOSIS — E039 Hypothyroidism, unspecified: Secondary | ICD-10-CM | POA: Diagnosis not present

## 2015-12-07 DIAGNOSIS — E119 Type 2 diabetes mellitus without complications: Secondary | ICD-10-CM | POA: Diagnosis not present

## 2015-12-21 DIAGNOSIS — Z0100 Encounter for examination of eyes and vision without abnormal findings: Secondary | ICD-10-CM | POA: Diagnosis not present

## 2015-12-21 DIAGNOSIS — E119 Type 2 diabetes mellitus without complications: Secondary | ICD-10-CM | POA: Diagnosis not present

## 2016-02-01 DIAGNOSIS — I10 Essential (primary) hypertension: Secondary | ICD-10-CM | POA: Diagnosis not present

## 2016-02-01 DIAGNOSIS — E119 Type 2 diabetes mellitus without complications: Secondary | ICD-10-CM | POA: Diagnosis not present

## 2016-02-01 DIAGNOSIS — E039 Hypothyroidism, unspecified: Secondary | ICD-10-CM | POA: Diagnosis not present

## 2016-02-07 DIAGNOSIS — I10 Essential (primary) hypertension: Secondary | ICD-10-CM | POA: Diagnosis not present

## 2016-02-07 DIAGNOSIS — E119 Type 2 diabetes mellitus without complications: Secondary | ICD-10-CM | POA: Diagnosis not present

## 2016-02-07 DIAGNOSIS — E039 Hypothyroidism, unspecified: Secondary | ICD-10-CM | POA: Diagnosis not present

## 2016-03-11 DIAGNOSIS — E039 Hypothyroidism, unspecified: Secondary | ICD-10-CM | POA: Diagnosis not present

## 2016-03-11 DIAGNOSIS — E119 Type 2 diabetes mellitus without complications: Secondary | ICD-10-CM | POA: Diagnosis not present

## 2016-03-11 DIAGNOSIS — I10 Essential (primary) hypertension: Secondary | ICD-10-CM | POA: Diagnosis not present

## 2016-03-26 ENCOUNTER — Ambulatory Visit (INDEPENDENT_AMBULATORY_CARE_PROVIDER_SITE_OTHER): Payer: Medicare Other | Admitting: Sports Medicine

## 2016-03-26 ENCOUNTER — Encounter: Payer: Self-pay | Admitting: Sports Medicine

## 2016-03-26 VITALS — BP 150/60 | HR 70 | Resp 12

## 2016-03-26 DIAGNOSIS — M79674 Pain in right toe(s): Secondary | ICD-10-CM

## 2016-03-26 DIAGNOSIS — E1100 Type 2 diabetes mellitus with hyperosmolarity without nonketotic hyperglycemic-hyperosmolar coma (NKHHC): Secondary | ICD-10-CM

## 2016-03-26 DIAGNOSIS — L603 Nail dystrophy: Secondary | ICD-10-CM | POA: Diagnosis not present

## 2016-03-26 DIAGNOSIS — G8929 Other chronic pain: Secondary | ICD-10-CM

## 2016-03-26 NOTE — Progress Notes (Deleted)
   Subjective:    Patient ID: [name] [name], male    DOB: 1941/11/06, 75 y.o.   MRN: [id number]  HPI    Review of Systems  Skin: Positive for color change.       Objective:   Physical Exam        Assessment & Plan:

## 2016-03-26 NOTE — Progress Notes (Signed)
Patient ID: [name] [name], male   DOB: 1941-02-05, 75 y.o.   MRN: [id number] Subjective: [name] [name] is a 75 y.o. diabetic male patient presents to office today complaining of a painful thickened right big toe nail. This has been present for 10 years. Patient has treated this by trimming; patient desires complete removal of the nail. Patient denies fever/chills/nausea/vomitting/any other related constitutional symptoms at this time.  FBS 170  Patient Active Problem List   Diagnosis Date Noted  . Cellulitis 11/24/2013  . Osteoarthritis of left knee 05/13/2013  . Hyponatremia 05/13/2013  . Swelling of limb 03/26/2013  . Chronic venous insufficiency 03/26/2013  . Preoperative examination 03/04/2013  . Trigger finger 11/14/2011  . CARPAL TUNNEL SYNDROME, RIGHT 09/24/2010  . ABDOMINAL BRUIT 05/23/2010  . HYPOTHYROIDISM 04/30/2010  . SLEEP APNEA, OBSTRUCTIVE 01/17/2010  . VENTRAL HERNIA [id number]  . ACTINIC KERATOSIS 08/28/2009  . CEREBROVASCULAR DISEASE 03/08/2009  . BENIGN PROSTATIC HYPERTROPHY, WITH URINARY OBSTRUCTION 10/06/2008  . EDEMA 05/25/2008  . HYPERLIPIDEMIA 10/03/2007  . HYPERTENSION 10/03/2007  . CORONARY ARTERY DISEASE 10/03/2007  . DIABETES MELLITUS, TYPE II, UNCONTROLLED 03/29/2007    Current Outpatient Prescriptions on File Prior to Visit  Medication Sig Dispense Refill  . amLODipine (NORVASC) 10 MG tablet TAKE 1 TABLET EVERY DAY (Patient taking differently: TAKE 1 TABLET EVERY DAY--  takes in am) 90 tablet 0  . BIDIL 20-37.5 MG per tablet TAKE 1 TABLET TWICE DAILY 180 tablet 1  . LANTUS 100 UNIT/ML injection inject 50 units subcutaneously every morning and 40 units at bedtime (Patient taking differently: inject 55 units subcutaneously every morning and 45 units at bedtime) 30 mL 0  . levothyroxine (SYNTHROID, LEVOTHROID) 25 MCG tablet TAKE 1 TABLET DAILY (Patient taking differently: TAKE 1 TABLET DAILY--   takes in am) 90 tablet 1  . metFORMIN (GLUCOPHAGE) 500 MG  tablet take 1 tablet by mouth twice a day 60 tablet 0  . metoprolol succinate (TOPROL-XL) 50 MG 24 hr tablet TAKE 1 TABLET EVERY DAY WITH OR IMMEDIATELY FOLLOWING A MEAL (Patient taking differently: TAKE 1 TABLET EVERY DAY WITH OR IMMEDIATELY FOLLOWING A MEAL----  takes in pm) 90 tablet 0  . ramipril (ALTACE) 10 MG capsule TAKE 1 CAPSULE TWICE DAILY 180 capsule 1  . torsemide (DEMADEX) 20 MG tablet TAKE 1 TABLET DAILY. (Patient taking differently: TAKE 1 TABLET DAILY.---   takes in am) 90 tablet 1  . oxyCODONE-acetaminophen (ROXICET) 5-325 MG per tablet Take 1-2 tablets by mouth every 4 (four) hours as needed for severe pain. 57 tablet 0  . simvastatin (ZOCOR) 80 MG tablet Take 80 mg by mouth every evening.    . tamsulosin (FLOMAX) 0.4 MG CAPS capsule take 1 capsule by mouth once daily (Patient taking differently: take 1 capsule by mouth once daily---   takes in hs) 90 capsule 0  . VOLTAREN 1 % GEL apply 2 gram topically to affected area three times a day (Patient taking differently: apply 2 gram topically to affected area three times a day--  prn) 100 g 2   No current facility-administered medications on file prior to visit.    Allergies  Allergen Reactions  . Primaxin [Imipenem] Rash    Objective:  There were no vitals filed for this visit.  General: Well developed, nourished, in no acute distress, alert and oriented x3   Dermatology: Skin is warm, dry and supple bilateral. Right hallux nail appears to be severely thickened and dystrophic. (-) Erythema. (-) Edema. (-) serosanguous drainage present.  The remaining nails appear free from acute symptoms at this time; evidence of previous nail removal at left hallux with small spicules of nail at site. There are no open sores, lesions or other signs of infection present.  Vascular: Dorsalis Pedis artery and Posterior Tibial artery pedal pulses are palpable bilateral with immedate capillary fill time. Pedal hair growth present. No lower extremity  edema.   Neruologic: Grossly intact via light touch bilateral. Protective intact with SWMF.   Musculoskeletal: Tenderness to palpation of the Right hallux nail. Muscular strength within normal limits in all groups bilateral.   Assesement and Plan: Problem List Items Addressed This Visit    None    Visit Diagnoses    Nail dystrophy    -  Primary    Toe pain, chronic, right        Type 2 diabetes mellitus with hyperosmolarity without coma, unspecified long term insulin use status ([facility])        Relevant Medications    atorvastatin (LIPITOR) 40 MG tablet    insulin lispro (HUMALOG) 100 UNIT/ML injection       -Discussed treatment alternatives and plan of care; Explained permanent/temporary nail avulsion and post procedure course to patient. Patient opt for total permanent nail avulsion at right hallux. - After a verbal consent, injected 3 ml of a 50:50 mixture of 2% plain  lidocaine and 0.5% plain marcaine in a normal hallux block fashion. Next, a  betadine prep was performed. Anesthesia was tested and found to be appropriate.  The right hallux nail then incised from the hyponychium to the epinychium. The total nail was removed and cleared from the field. The area was curretted for any remaining nail or spicules. Phenol application performed and the area was then flushed with alcohol and dressed with antibiotic cream and a dry sterile dressing. -Patient was instructed to leave the dressing intact for today and begin soaking  in a weak solution of betadine and water tomorrow. Patient was instructed to  soak for 15 minutes each day and apply neosporin and a gauze or bandaid dressing each day. Advised patient to soak with care since he is diabetic.  -Patient was instructed to monitor the toe for signs of infection and return to office or go to ER if toe becomes red, hot or swollen. -Patient is to return in 1 week for follow up care/nail check or sooner if problems arise.  [name],  DPM

## 2016-03-26 NOTE — Patient Instructions (Signed)

## 2016-04-09 ENCOUNTER — Encounter: Payer: Self-pay | Admitting: Sports Medicine

## 2016-04-09 ENCOUNTER — Ambulatory Visit (INDEPENDENT_AMBULATORY_CARE_PROVIDER_SITE_OTHER): Payer: Medicare Other | Admitting: Sports Medicine

## 2016-04-09 DIAGNOSIS — M79674 Pain in right toe(s): Secondary | ICD-10-CM

## 2016-04-09 DIAGNOSIS — G8929 Other chronic pain: Secondary | ICD-10-CM

## 2016-04-09 DIAGNOSIS — L603 Nail dystrophy: Secondary | ICD-10-CM

## 2016-04-09 DIAGNOSIS — E1100 Type 2 diabetes mellitus with hyperosmolarity without nonketotic hyperglycemic-hyperosmolar coma (NKHHC): Secondary | ICD-10-CM

## 2016-04-09 NOTE — Progress Notes (Signed)
Patient ID: [name] [name], male   DOB: November 01, 1941, 75 y.o.   MRN: [id number] Subjective: [name] [name] is a 75 y.o. male patient returns to office today for follow up evaluation after having Right Hallux total permanent nail avulsion performed on 03-26-16. Patient has been soaking using betadine and applying topical antibiotic covered with bandaid daily. Patient deniesfever/chills/nausea/vomitting/any other related constitutional symptoms at this time.  FBS 200mg /dl  Patient Active Problem List   Diagnosis Date Noted  . Cellulitis 11/24/2013  . Osteoarthritis of left knee 05/13/2013  . Hyponatremia 05/13/2013  . Swelling of limb 03/26/2013  . Chronic venous insufficiency 03/26/2013  . Preoperative examination 03/04/2013  . Trigger finger 11/14/2011  . CARPAL TUNNEL SYNDROME, RIGHT 09/24/2010  . ABDOMINAL BRUIT 05/23/2010  . HYPOTHYROIDISM 04/30/2010  . SLEEP APNEA, OBSTRUCTIVE 01/17/2010  . VENTRAL HERNIA 12020/09/1109  . ACTINIC KERATOSIS 08/28/2009  . CEREBROVASCULAR DISEASE 03/08/2009  . BENIGN PROSTATIC HYPERTROPHY, WITH URINARY OBSTRUCTION 10/06/2008  . EDEMA 05/25/2008  . HYPERLIPIDEMIA 10/03/2007  . HYPERTENSION 10/03/2007  . CORONARY ARTERY DISEASE 10/03/2007  . DIABETES MELLITUS, TYPE II, UNCONTROLLED 03/29/2007    Current Outpatient Prescriptions on File Prior to Visit  Medication Sig Dispense Refill  . amLODipine (NORVASC) 10 MG tablet TAKE 1 TABLET EVERY DAY (Patient taking differently: TAKE 1 TABLET EVERY DAY--  takes in am) 90 tablet 0  . atorvastatin (LIPITOR) 40 MG tablet Take 40 mg by mouth daily.    Marland Kitchen BIDIL 20-37.5 MG per tablet TAKE 1 TABLET TWICE DAILY 180 tablet 1  . insulin lispro (HUMALOG) 100 UNIT/ML injection Inject into the skin 3 (three) times daily before meals.    Marland Kitchen LANTUS 100 UNIT/ML injection inject 50 units subcutaneously every morning and 40 units at bedtime (Patient taking differently: inject 55 units subcutaneously every morning and 45 units at bedtime)  30 mL 0  . levothyroxine (SYNTHROID, LEVOTHROID) 25 MCG tablet TAKE 1 TABLET DAILY (Patient taking differently: TAKE 1 TABLET DAILY--   takes in am) 90 tablet 1  . metFORMIN (GLUCOPHAGE) 500 MG tablet take 1 tablet by mouth twice a day 60 tablet 0  . metoprolol succinate (TOPROL-XL) 50 MG 24 hr tablet TAKE 1 TABLET EVERY DAY WITH OR IMMEDIATELY FOLLOWING A MEAL (Patient taking differently: TAKE 1 TABLET EVERY DAY WITH OR IMMEDIATELY FOLLOWING A MEAL----  takes in pm) 90 tablet 0  . oxyCODONE-acetaminophen (ROXICET) 5-325 MG per tablet Take 1-2 tablets by mouth every 4 (four) hours as needed for severe pain. 57 tablet 0  . ramipril (ALTACE) 10 MG capsule TAKE 1 CAPSULE TWICE DAILY 180 capsule 1  . simvastatin (ZOCOR) 80 MG tablet Take 80 mg by mouth every evening.    . tamsulosin (FLOMAX) 0.4 MG CAPS capsule take 1 capsule by mouth once daily (Patient taking differently: take 1 capsule by mouth once daily---   takes in hs) 90 capsule 0  . torsemide (DEMADEX) 20 MG tablet TAKE 1 TABLET DAILY. (Patient taking differently: TAKE 1 TABLET DAILY.---   takes in am) 90 tablet 1  . VOLTAREN 1 % GEL apply 2 gram topically to affected area three times a day (Patient taking differently: apply 2 gram topically to affected area three times a day--  prn) 100 g 2   No current facility-administered medications on file prior to visit.    Allergies  Allergen Reactions  . Primaxin [Imipenem] Rash    Objective:  General: Well developed, nourished, in no acute distress, alert and oriented x3   Dermatology: Skin  is warm, dry and supple bilateral. Right hallux nail bed appears to be clean, dry, with mild fibrogranular tissue and surrounding eschar/scab. (-) Erythema. (-) Edema. (-) serosanguous drainage present. The remaining nails appear unremarkable at this time free from ingrowing however short and thickened consistent with mycosis. There are no other lesions or other signs of infection present.  Neurovascular  status: Intact. No lower extremity swelling; No pain with calf compression bilateral.  Musculoskeletal: Decreased tenderness to palpation of the Right hallux nail bed. Muscular strength within normal limits bilateral.   Assesement and Plan: Problem List Items Addressed This Visit    None    Visit Diagnoses    Nail dystrophy    -  Primary    s/p Total permenant nail removal right hallux 03-26-16    Toe pain, chronic, right        Type 2 diabetes mellitus with hyperosmolarity without coma, unspecified long term insulin use status ([place])          -Examined patient  -Stressed the importance of good glycemic control -Cleansed right hallux and gently scrubbed with peroxide removing away eschar at site and applied antibiotic cream covered with bandaid.  -Discussed plan of care with patient. -Patient to now begin soaking in a weak solution of Epsom salt and warm water. Patient was instructed to soak for 15-20 minutes each day until the toe appears normal and there is no drainage, redness, tenderness, or swelling at the procedure site, and apply neosporin and a gauze or bandaid dressing each day as needed. May leave open to air at night. -Educated patient on long term care after nail surgery. -Patient was instructed to monitor the toe for reoccurrence and signs of infection; Patient advised to return to office or go to ER if toe becomes red, hot or swollen. -Patient is to return for routine diabetic nail care or sooner if problems arise.  [name] [name], DPM

## 2016-04-17 ENCOUNTER — Ambulatory Visit (INDEPENDENT_AMBULATORY_CARE_PROVIDER_SITE_OTHER): Payer: Medicare Other | Admitting: Pulmonary Disease

## 2016-04-17 ENCOUNTER — Encounter: Payer: Self-pay | Admitting: Pulmonary Disease

## 2016-04-17 VITALS — BP 132/62 | HR 70 | Ht 68.0 in | Wt 314.8 lb

## 2016-04-17 DIAGNOSIS — G4733 Obstructive sleep apnea (adult) (pediatric): Secondary | ICD-10-CM
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
## 2016-04-17 NOTE — Patient Instructions (Signed)
Your CPAP is set at 14 cm Call us of this malfunctions and we will send prescription to replace CPAP supplies will be renewed

## 2016-04-17 NOTE — Assessment & Plan Note (Signed)
CPAP is set at 14 cm Call us of this malfunctions and we will send prescription to replace CPAP supplies will be renewed  Weight loss encouraged, compliance with goal of at least 4-6 hrs every night is the expectation. Advised against medications with sedative side effects Cautioned against driving when sleepy - understanding that sleepiness will vary on a day to day basis

## 2016-05-02 DIAGNOSIS — Z0000 Encounter for general adult medical examination without abnormal findings: Secondary | ICD-10-CM | POA: Diagnosis not present

## 2016-05-02 DIAGNOSIS — E119 Type 2 diabetes mellitus without complications: Secondary | ICD-10-CM | POA: Diagnosis not present

## 2016-05-02 DIAGNOSIS — Z23 Encounter for immunization: Secondary | ICD-10-CM | POA: Diagnosis not present

## 2016-05-02 DIAGNOSIS — M546 Pain in thoracic spine: Secondary | ICD-10-CM | POA: Diagnosis not present

## 2016-06-10 DIAGNOSIS — E119 Type 2 diabetes mellitus without complications: Secondary | ICD-10-CM | POA: Diagnosis not present

## 2016-06-10 DIAGNOSIS — I10 Essential (primary) hypertension: Secondary | ICD-10-CM | POA: Diagnosis not present

## 2016-06-11 ENCOUNTER — Ambulatory Visit (INDEPENDENT_AMBULATORY_CARE_PROVIDER_SITE_OTHER): Payer: Medicare Other | Admitting: Sports Medicine

## 2016-06-11 ENCOUNTER — Encounter: Payer: Self-pay | Admitting: Sports Medicine

## 2016-06-11 DIAGNOSIS — Z9889 Other specified postprocedural states: Secondary | ICD-10-CM

## 2016-06-11 DIAGNOSIS — E1100 Type 2 diabetes mellitus with hyperosmolarity without nonketotic hyperglycemic-hyperosmolar coma (NKHHC): Secondary | ICD-10-CM

## 2016-06-11 DIAGNOSIS — G8929 Other chronic pain: Secondary | ICD-10-CM

## 2016-06-11 DIAGNOSIS — M79674 Pain in right toe(s): Secondary | ICD-10-CM

## 2016-06-11 NOTE — Progress Notes (Signed)
Patient ID: [name] [name], male   DOB: 06-Dec-1941, 75 y.o.   MRN: [id number]   Subjective: TADEH FACEMIRE is a 75 y.o. Diabetic male patient returns to office today for follow up evaluation after having Right Hallux total permanent nail avulsion performed on 03-26-16. Patient states that pain is better no problems with big toe. Patient deniesfever/chills/nausea/vomitting/any other related constitutional symptoms at this time.  FBS 300mg /dl  Patient Active Problem List   Diagnosis Date Noted  . Cellulitis 11/24/2013  . Osteoarthritis of left knee 05/13/2013  . Hyponatremia 05/13/2013  . Swelling of limb 03/26/2013  . Chronic venous insufficiency 03/26/2013  . Preoperative examination 03/04/2013  . Trigger finger 11/14/2011  . CARPAL TUNNEL SYNDROME, RIGHT 09/24/2010  . ABDOMINAL BRUIT 05/23/2010  . HYPOTHYROIDISM 04/30/2010  . Obstructive sleep apnea 01/17/2010  . VENTRAL HERNIA [id number]  . ACTINIC KERATOSIS 08/28/2009  . CEREBROVASCULAR DISEASE 03/08/2009  . BENIGN PROSTATIC HYPERTROPHY, WITH URINARY OBSTRUCTION 10/06/2008  . EDEMA 05/25/2008  . HYPERLIPIDEMIA 10/03/2007  . HYPERTENSION 10/03/2007  . CORONARY ARTERY DISEASE 10/03/2007  . DIABETES MELLITUS, TYPE II, UNCONTROLLED 03/29/2007    Current Outpatient Prescriptions on File Prior to Visit  Medication Sig Dispense Refill  . amLODipine (NORVASC) 10 MG tablet TAKE 1 TABLET EVERY DAY (Patient taking differently: TAKE 1 TABLET EVERY DAY--  takes in am) 90 tablet 0  . BIDIL 20-37.5 MG per tablet TAKE 1 TABLET TWICE DAILY 180 tablet 1  . cloNIDine (CATAPRES) 0.1 MG tablet   1  . LANTUS 100 UNIT/ML injection inject 50 units subcutaneously every morning and 40 units at bedtime (Patient taking differently: inject 55 units subcutaneously every morning and 45 units at bedtime) 30 mL 0  . levothyroxine (SYNTHROID, LEVOTHROID) 25 MCG tablet TAKE 1 TABLET DAILY (Patient taking differently: TAKE 1 TABLET DAILY--   takes in am) 90 tablet 1   . metFORMIN (GLUCOPHAGE) 500 MG tablet take 1 tablet by mouth twice a day 60 tablet 0  . metoprolol succinate (TOPROL-XL) 50 MG 24 hr tablet TAKE 1 TABLET EVERY DAY WITH OR IMMEDIATELY FOLLOWING A MEAL (Patient taking differently: TAKE 1 TABLET EVERY DAY WITH OR IMMEDIATELY FOLLOWING A MEAL----  takes in pm) 90 tablet 0  . NOVOLOG 100 UNIT/ML injection inject 25 units subcutaneously twice a day  0  . ramipril (ALTACE) 10 MG capsule TAKE 1 CAPSULE TWICE DAILY 180 capsule 1  . simvastatin (ZOCOR) 80 MG tablet Take 80 mg by mouth every evening.    . tamsulosin (FLOMAX) 0.4 MG CAPS capsule take 1 capsule by mouth once daily (Patient taking differently: take 1 capsule by mouth once daily---   takes in hs) 90 capsule 0  . torsemide (DEMADEX) 20 MG tablet TAKE 1 TABLET DAILY. (Patient taking differently: TAKE 1 TABLET DAILY.---   takes in am) 90 tablet 1  . VOLTAREN 1 % GEL apply 2 gram topically to affected area three times a day (Patient taking differently: apply 2 gram topically to affected area three times a day--  prn) 100 g 2   No current facility-administered medications on file prior to visit.    Allergies  Allergen Reactions  . Primaxin [Imipenem] Rash    Objective:  General: Well developed, nourished, in no acute distress, alert and oriented x3   Dermatology: Skin is warm, dry and supple bilateral. Right hallux nail bed appears to be clean, dry,mild eschar/scab. (-) Erythema. (-) Edema. (-) serosanguous drainage present. The remaining nails appear unremarkable at this time free from ingrowing  however short and thickened consistent with mycosis. There are no other lesions or other signs of infection present.  Neurovascular status: Intact. No lower extremity swelling; No pain with calf compression bilateral.  Musculoskeletal: No tenderness to palpation of the Right hallux nail bed. Muscular strength within normal limits bilateral.   Assesement and Plan: Problem List Items Addressed This  Visit    None    Visit Diagnoses    S/P nail surgery    -  Primary    Right hallux permanent total nail avulsion    Toe pain, chronic, right        Hallux    Type 2 diabetes mellitus with hyperosmolarity without coma, unspecified long term insulin use status ([place])          -Examined patient  -Stressed the importance of good glycemic control -Right hallux is clean and dry healing well. Soaking or dressings are no longer needed at this time -Educated patient on long term care after nail surgery. -Patient was instructed to monitor the toe for reoccurrence and signs of infection; Patient advised to return to office or go to ER if toe becomes red, hot or swollen. -Patient is to return for routine diabetic nail care/exam in 6 months (wife helps with nail care) or sooner if problems arise.  [name], DPM

## 2016-06-14 DIAGNOSIS — E119 Type 2 diabetes mellitus without complications: Secondary | ICD-10-CM | POA: Diagnosis not present

## 2016-06-14 DIAGNOSIS — I10 Essential (primary) hypertension: Secondary | ICD-10-CM | POA: Diagnosis not present

## 2016-06-14 DIAGNOSIS — E039 Hypothyroidism, unspecified: Secondary | ICD-10-CM | POA: Diagnosis not present

## 2016-06-14 DIAGNOSIS — E785 Hyperlipidemia, unspecified: Secondary | ICD-10-CM | POA: Diagnosis not present

## 2016-07-09 DIAGNOSIS — C61 Malignant neoplasm of prostate: Secondary | ICD-10-CM | POA: Diagnosis not present

## 2016-08-06 DIAGNOSIS — E291 Testicular hypofunction: Secondary | ICD-10-CM | POA: Diagnosis not present

## 2016-08-06 DIAGNOSIS — C61 Malignant neoplasm of prostate: Secondary | ICD-10-CM | POA: Diagnosis not present

## 2016-08-06 DIAGNOSIS — N5201 Erectile dysfunction due to arterial insufficiency: Secondary | ICD-10-CM | POA: Diagnosis not present

## 2016-09-11 DIAGNOSIS — I10 Essential (primary) hypertension: Secondary | ICD-10-CM | POA: Diagnosis not present

## 2016-09-11 DIAGNOSIS — E039 Hypothyroidism, unspecified: Secondary | ICD-10-CM | POA: Diagnosis not present

## 2016-09-11 DIAGNOSIS — E119 Type 2 diabetes mellitus without complications: Secondary | ICD-10-CM | POA: Diagnosis not present

## 2016-09-16 DIAGNOSIS — Z23 Encounter for immunization: Secondary | ICD-10-CM | POA: Diagnosis not present

## 2016-09-16 DIAGNOSIS — E785 Hyperlipidemia, unspecified: Secondary | ICD-10-CM | POA: Diagnosis not present

## 2016-09-16 DIAGNOSIS — I10 Essential (primary) hypertension: Secondary | ICD-10-CM | POA: Diagnosis not present

## 2016-09-16 DIAGNOSIS — E039 Hypothyroidism, unspecified: Secondary | ICD-10-CM | POA: Diagnosis not present

## 2016-09-16 DIAGNOSIS — E119 Type 2 diabetes mellitus without complications: Secondary | ICD-10-CM | POA: Diagnosis not present

## 2016-09-17 DIAGNOSIS — E119 Type 2 diabetes mellitus without complications: Secondary | ICD-10-CM | POA: Diagnosis not present

## 2016-09-19 DIAGNOSIS — M1990 Unspecified osteoarthritis, unspecified site: Secondary | ICD-10-CM | POA: Diagnosis not present

## 2016-09-19 DIAGNOSIS — E118 Type 2 diabetes mellitus with unspecified complications: Secondary | ICD-10-CM | POA: Diagnosis not present

## 2016-09-19 DIAGNOSIS — E119 Type 2 diabetes mellitus without complications: Secondary | ICD-10-CM | POA: Diagnosis not present

## 2016-09-27 DIAGNOSIS — E119 Type 2 diabetes mellitus without complications: Secondary | ICD-10-CM | POA: Diagnosis not present

## 2016-10-10 ENCOUNTER — Other Ambulatory Visit: Payer: Self-pay | Admitting: Physician Assistant

## 2016-10-22 DIAGNOSIS — E039 Hypothyroidism, unspecified: Secondary | ICD-10-CM | POA: Diagnosis not present

## 2016-10-24 ENCOUNTER — Telehealth: Payer: Self-pay | Admitting: Pulmonary Disease

## 2016-10-24 NOTE — Telephone Encounter (Signed)
lmomtcb x1 

## 2016-10-28 NOTE — Telephone Encounter (Signed)
Pt states he needs an OV with [facility] for new cpap per his DME company.  Pt is already scheduled for 11/13 with [facility].  nothing further needed.

## 2016-10-30 DIAGNOSIS — E119 Type 2 diabetes mellitus without complications: Secondary | ICD-10-CM | POA: Diagnosis not present

## 2016-10-30 DIAGNOSIS — I10 Essential (primary) hypertension: Secondary | ICD-10-CM | POA: Diagnosis not present

## 2016-10-30 DIAGNOSIS — E039 Hypothyroidism, unspecified: Secondary | ICD-10-CM | POA: Diagnosis not present

## 2016-10-30 DIAGNOSIS — Z125 Encounter for screening for malignant neoplasm of prostate: Secondary | ICD-10-CM | POA: Diagnosis not present

## 2016-11-11 ENCOUNTER — Encounter: Payer: Self-pay | Admitting: Pulmonary Disease

## 2016-11-11 ENCOUNTER — Ambulatory Visit (INDEPENDENT_AMBULATORY_CARE_PROVIDER_SITE_OTHER): Payer: Medicare Other | Admitting: Pulmonary Disease

## 2016-11-11 DIAGNOSIS — G4733 Obstructive sleep apnea (adult) (pediatric): Secondary | ICD-10-CM | POA: Diagnosis not present

## 2016-11-11 NOTE — Addendum Note (Signed)
Addended by: Benson Setting L on: 11/11/2016 04:14 PM   Modules accepted: Orders

## 2016-11-11 NOTE — Progress Notes (Signed)
   Subjective:    Patient ID: [name] [name], male    DOB: 08/23/1941, 75 y.o.   MRN: [id number]  HPI  75 year old obese diabetic and hypertensive for FU of OSA. PSG 2010 >> AHI of 99/hour and has been maintained on CPAP of 14 cm since then with a full face mask.    11/11/2016  Chief Complaint  Patient presents with  . Follow-up    Pt. currently using a CPAP machine states he was told it was time for a new one, Pt. is using it every night,  DME: [employer]     He reports remarkable improvement in his symptoms of snoring and daytime somnolence Since he got on the CPAP machine for 5 years ago He reports good compliance with his machine wakes up feeling rested. No snoring has been noted by his wife   He has been told by his DME that he qualifies for a new CPAP . He had to replace the motor on his machine to 3 years ago, and he has noticed some variability in the amount of aerated is putting out recently  His weight is unchanged at 315 pounds He is hypertensive requiring 4 medications  Review of Systems Patient denies significant dyspnea,cough, hemoptysis,  chest pain, palpitations, pedal edema, orthopnea, paroxysmal nocturnal dyspnea, lightheadedness, nausea, vomiting, abdominal or  leg pains      Objective:   Physical Exam  Gen. Pleasant, obese, in no distress ENT -  no post nasal drip, mallampati Class II airway Neck: No JVD, no thyromegaly, no carotid bruits Lungs: no use of accessory muscles, no dullness to percussion, decreased without rales or rhonchi  Cardiovascular: Rhythm regular, heart sounds  normal, no murmurs or gallops, no peripheral edema Musculoskeletal: No deformities, no cyanosis or clubbing , no tremors       Assessment & Plan:

## 2016-11-11 NOTE — Assessment & Plan Note (Signed)
Prescription for new CPAP 14 cm to be sent to [place] We will obtain a report in 4 weeks to ensure settings are correct  Weight loss encouraged, compliance with goal of at least 4-6 hrs every night is the expectation. Advised against medications with sedative side effects Cautioned against driving when sleepy - understanding that sleepiness will vary on a day to day basis

## 2016-11-11 NOTE — Patient Instructions (Signed)
Prescription for new CPAP 14 cm to be sent to [facility] We will obtain a report in 4 weeks to ensure settings are correct

## 2016-11-11 NOTE — Assessment & Plan Note (Signed)
Weight loss recommended 

## 2016-12-09 ENCOUNTER — Ambulatory Visit: Payer: Medicare Other | Admitting: Sports Medicine

## 2016-12-10 ENCOUNTER — Ambulatory Visit: Payer: Medicare Other | Admitting: Sports Medicine

## 2016-12-26 ENCOUNTER — Encounter: Payer: Self-pay | Admitting: Pulmonary Disease

## 2017-01-02 ENCOUNTER — Telehealth: Payer: Self-pay | Admitting: Pulmonary Disease

## 2017-01-02 NOTE — Telephone Encounter (Signed)
LM x 1 

## 2017-01-02 NOTE — Telephone Encounter (Signed)
Per Dr Elsworth Soho:  CPAP download showed good usage.  Events are well controlled on current pressure of 14cm H2O No changes to pressure setting at this time.

## 2017-01-08 NOTE — Telephone Encounter (Signed)
lmtcb X 2 

## 2017-01-10 NOTE — Telephone Encounter (Signed)
Called patient at home, [facility]. Called patient on cell, never answered.

## 2017-01-17 NOTE — Telephone Encounter (Signed)
Closing this note since it was been 2 weeks since the last message and patient hasnt responded.

## 2017-01-30 DIAGNOSIS — I10 Essential (primary) hypertension: Secondary | ICD-10-CM | POA: Diagnosis not present

## 2017-01-30 DIAGNOSIS — E119 Type 2 diabetes mellitus without complications: Secondary | ICD-10-CM | POA: Diagnosis not present

## 2017-01-30 DIAGNOSIS — E039 Hypothyroidism, unspecified: Secondary | ICD-10-CM | POA: Diagnosis not present

## 2017-02-06 DIAGNOSIS — E039 Hypothyroidism, unspecified: Secondary | ICD-10-CM | POA: Diagnosis not present

## 2017-02-06 DIAGNOSIS — I10 Essential (primary) hypertension: Secondary | ICD-10-CM | POA: Diagnosis not present

## 2017-02-06 DIAGNOSIS — E119 Type 2 diabetes mellitus without complications: Secondary | ICD-10-CM | POA: Diagnosis not present

## 2017-02-12 DIAGNOSIS — Z0000 Encounter for general adult medical examination without abnormal findings: Secondary | ICD-10-CM | POA: Diagnosis not present

## 2017-02-12 DIAGNOSIS — E119 Type 2 diabetes mellitus without complications: Secondary | ICD-10-CM | POA: Diagnosis not present

## 2017-02-12 DIAGNOSIS — I10 Essential (primary) hypertension: Secondary | ICD-10-CM | POA: Diagnosis not present

## 2017-02-12 DIAGNOSIS — E039 Hypothyroidism, unspecified: Secondary | ICD-10-CM | POA: Diagnosis not present

## 2017-04-29 DIAGNOSIS — E118 Type 2 diabetes mellitus with unspecified complications: Secondary | ICD-10-CM | POA: Diagnosis not present

## 2017-05-08 DIAGNOSIS — E119 Type 2 diabetes mellitus without complications: Secondary | ICD-10-CM | POA: Diagnosis not present

## 2017-05-08 DIAGNOSIS — E039 Hypothyroidism, unspecified: Secondary | ICD-10-CM | POA: Diagnosis not present

## 2017-05-08 DIAGNOSIS — I10 Essential (primary) hypertension: Secondary | ICD-10-CM | POA: Diagnosis not present

## 2017-05-10 ENCOUNTER — Encounter (HOSPITAL_BASED_OUTPATIENT_CLINIC_OR_DEPARTMENT_OTHER): Payer: Self-pay | Admitting: Emergency Medicine

## 2017-05-10 ENCOUNTER — Emergency Department (HOSPITAL_BASED_OUTPATIENT_CLINIC_OR_DEPARTMENT_OTHER)
Admission: EM | Admit: 2017-05-10 | Discharge: 2017-05-10 | Disposition: A | Discharge status: Home / Self Care | Payer: Medicare Other | Attending: Emergency Medicine | Admitting: Emergency Medicine

## 2017-05-10 DIAGNOSIS — Z8546 Personal history of malignant neoplasm of prostate: Secondary | ICD-10-CM | POA: Diagnosis not present

## 2017-05-10 DIAGNOSIS — Y929 Unspecified place or not applicable: Secondary | ICD-10-CM | POA: Insufficient documentation

## 2017-05-10 DIAGNOSIS — W231XXA Caught, crushed, jammed, or pinched between stationary objects, initial encounter: Secondary | ICD-10-CM | POA: Insufficient documentation

## 2017-05-10 DIAGNOSIS — S61411A Laceration without foreign body of right hand, initial encounter: Secondary | ICD-10-CM | POA: Diagnosis not present

## 2017-05-10 DIAGNOSIS — E039 Hypothyroidism, unspecified: Secondary | ICD-10-CM | POA: Insufficient documentation

## 2017-05-10 DIAGNOSIS — Z87891 Personal history of nicotine dependence: Secondary | ICD-10-CM | POA: Diagnosis not present

## 2017-05-10 DIAGNOSIS — Z79899 Other long term (current) drug therapy: Secondary | ICD-10-CM | POA: Insufficient documentation

## 2017-05-10 DIAGNOSIS — E119 Type 2 diabetes mellitus without complications: Secondary | ICD-10-CM | POA: Diagnosis not present

## 2017-05-10 DIAGNOSIS — Y999 Unspecified external cause status: Secondary | ICD-10-CM | POA: Insufficient documentation

## 2017-05-10 DIAGNOSIS — Y9389 Activity, other specified: Secondary | ICD-10-CM | POA: Diagnosis not present

## 2017-05-10 DIAGNOSIS — I2510 Atherosclerotic heart disease of native coronary artery without angina pectoris: Secondary | ICD-10-CM | POA: Diagnosis not present

## 2017-05-10 DIAGNOSIS — I10 Essential (primary) hypertension: Secondary | ICD-10-CM | POA: Diagnosis not present

## 2017-05-10 MED ORDER — LIDOCAINE-EPINEPHRINE (PF) 2 %-1:200000 IJ SOLN
20.0000 mL | Freq: Once | INTRAMUSCULAR | Status: AC
  Administered 2017-05-10: 20 mL
  Filled 2017-05-10: qty 20

## 2017-05-10 MED ORDER — CEPHALEXIN 500 MG PO CAPS: 500.0000 mg | ORAL_CAPSULE | Freq: Three times a day (TID) | ORAL | 0 refills | Status: DC

## 2017-05-10 NOTE — ED Notes (Signed)
ED Provider at bedside. 

## 2017-05-10 NOTE — ED Triage Notes (Signed)
Patent states that he tripped and fell earlier this am  - cut to his right arm and to his right thumn
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
## 2017-05-10 NOTE — Discharge Instructions (Signed)
Please read and follow all provided instructions.  Your diagnoses today include:  1. Laceration of right hand without foreign body, initial encounter     Tests performed today include:  X-ray of the affected area that did not show any foreign bodies or broken bones  Vital signs. See below for your results today.   Medications prescribed:   Keflex (cephalexin) - antibiotic  You have been prescribed an antibiotic medicine: take the entire course of medicine even if you are feeling better. Stopping early can cause the antibiotic not to work.  Take any prescribed medications only as directed.   Home care instructions:  Follow any educational materials and wound care instructions contained in this packet.   Keep affected area above the level of your heart when possible to minimize swelling. Wash area gently twice a day with warm soapy water. Do not apply alcohol or hydrogen peroxide. Cover the area if it draining or weeping.   Follow-up instructions: Suture Removal: Return to the Emergency Department or see your primary care care doctor in 10-14 days for a recheck of your wound and removal of your sutures or staples.    Return instructions:  Return to the Emergency Department if you have:  Fever  Worsening pain  Worsening swelling of the wound  Pus draining from the wound  Redness of the skin that moves away from the wound, especially if it streaks away from the affected area   Any other emergent concerns  Your vital signs today were: BP 135/87    Pulse 67    Temp 98.3 F (36.8 C) (Oral)    Resp 20    Ht 5\' 8"  (1.727 m)    Wt (!) 140.6 kg    SpO2 98%    BMI 47.14 kg/m  If your blood pressure (BP) was elevated above 135/85 this visit, please have this repeated by your doctor within one month. --------------

## 2017-05-10 NOTE — ED Notes (Signed)
Pt given d/c instructions as per chart. Rx x 1. Verbalizes understanding. No questions. 

## 2017-05-10 NOTE — ED Provider Notes (Signed)
Pt seen and evaluated. D/W PA Geiple. No concerning neuro symptoms. Abrasion noted. Laceration repaired. NV ecam to hand normal. Normal ROM. Agree with treatment and disposition. No indications for neuro imaging.   Tanna Furry, MD 05/10/17 2101
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
## 2017-05-13 DIAGNOSIS — Z0000 Encounter for general adult medical examination without abnormal findings: Secondary | ICD-10-CM | POA: Diagnosis not present

## 2017-05-13 DIAGNOSIS — E118 Type 2 diabetes mellitus with unspecified complications: Secondary | ICD-10-CM | POA: Diagnosis not present

## 2017-05-13 DIAGNOSIS — E1142 Type 2 diabetes mellitus with diabetic polyneuropathy: Secondary | ICD-10-CM | POA: Diagnosis not present

## 2017-05-13 DIAGNOSIS — Z794 Long term (current) use of insulin: Secondary | ICD-10-CM | POA: Diagnosis not present

## 2017-05-13 DIAGNOSIS — E039 Hypothyroidism, unspecified: Secondary | ICD-10-CM | POA: Diagnosis not present

## 2017-05-20 DIAGNOSIS — E119 Type 2 diabetes mellitus without complications: Secondary | ICD-10-CM | POA: Diagnosis not present

## 2017-06-17 DIAGNOSIS — E119 Type 2 diabetes mellitus without complications: Secondary | ICD-10-CM | POA: Diagnosis not present

## 2017-06-17 DIAGNOSIS — E118 Type 2 diabetes mellitus with unspecified complications: Secondary | ICD-10-CM | POA: Diagnosis not present

## 2017-06-24 DIAGNOSIS — E119 Type 2 diabetes mellitus without complications: Secondary | ICD-10-CM | POA: Diagnosis not present

## 2017-07-15 DIAGNOSIS — E119 Type 2 diabetes mellitus without complications: Secondary | ICD-10-CM | POA: Diagnosis not present

## 2017-07-30 DIAGNOSIS — H52203 Unspecified astigmatism, bilateral: Secondary | ICD-10-CM | POA: Diagnosis not present

## 2017-07-30 DIAGNOSIS — E119 Type 2 diabetes mellitus without complications: Secondary | ICD-10-CM | POA: Diagnosis not present

## 2017-07-30 DIAGNOSIS — H524 Presbyopia: Secondary | ICD-10-CM | POA: Diagnosis not present

## 2017-07-31 DIAGNOSIS — C61 Malignant neoplasm of prostate: Secondary | ICD-10-CM | POA: Diagnosis not present

## 2017-07-31 DIAGNOSIS — E118 Type 2 diabetes mellitus with unspecified complications: Secondary | ICD-10-CM | POA: Diagnosis not present

## 2017-07-31 DIAGNOSIS — E119 Type 2 diabetes mellitus without complications: Secondary | ICD-10-CM | POA: Diagnosis not present

## 2017-07-31 DIAGNOSIS — E785 Hyperlipidemia, unspecified: Secondary | ICD-10-CM | POA: Diagnosis not present

## 2017-08-04 DIAGNOSIS — E118 Type 2 diabetes mellitus with unspecified complications: Secondary | ICD-10-CM | POA: Diagnosis not present

## 2017-08-04 DIAGNOSIS — Z794 Long term (current) use of insulin: Secondary | ICD-10-CM | POA: Diagnosis not present

## 2017-08-04 DIAGNOSIS — E7800 Pure hypercholesterolemia, unspecified: Secondary | ICD-10-CM | POA: Diagnosis not present

## 2017-08-04 DIAGNOSIS — I10 Essential (primary) hypertension: Secondary | ICD-10-CM | POA: Diagnosis not present

## 2017-08-07 DIAGNOSIS — R351 Nocturia: Secondary | ICD-10-CM | POA: Diagnosis not present

## 2017-08-07 DIAGNOSIS — N401 Enlarged prostate with lower urinary tract symptoms: Secondary | ICD-10-CM | POA: Diagnosis not present

## 2017-08-07 DIAGNOSIS — N5201 Erectile dysfunction due to arterial insufficiency: Secondary | ICD-10-CM | POA: Diagnosis not present

## 2017-08-07 DIAGNOSIS — C61 Malignant neoplasm of prostate: Secondary | ICD-10-CM | POA: Diagnosis not present

## 2017-08-25 DIAGNOSIS — E785 Hyperlipidemia, unspecified: Secondary | ICD-10-CM | POA: Diagnosis not present

## 2017-08-25 DIAGNOSIS — E119 Type 2 diabetes mellitus without complications: Secondary | ICD-10-CM | POA: Diagnosis not present

## 2017-08-25 DIAGNOSIS — I10 Essential (primary) hypertension: Secondary | ICD-10-CM | POA: Diagnosis not present

## 2017-10-28 DIAGNOSIS — E118 Type 2 diabetes mellitus with unspecified complications: Secondary | ICD-10-CM | POA: Diagnosis not present

## 2017-10-28 DIAGNOSIS — I10 Essential (primary) hypertension: Secondary | ICD-10-CM | POA: Diagnosis not present

## 2017-11-03 DIAGNOSIS — I10 Essential (primary) hypertension: Secondary | ICD-10-CM | POA: Diagnosis not present

## 2017-11-03 DIAGNOSIS — Z794 Long term (current) use of insulin: Secondary | ICD-10-CM | POA: Diagnosis not present

## 2017-11-03 DIAGNOSIS — Z23 Encounter for immunization: Secondary | ICD-10-CM | POA: Diagnosis not present

## 2017-11-03 DIAGNOSIS — E118 Type 2 diabetes mellitus with unspecified complications: Secondary | ICD-10-CM | POA: Diagnosis not present

## 2017-11-03 DIAGNOSIS — E7800 Pure hypercholesterolemia, unspecified: Secondary | ICD-10-CM | POA: Diagnosis not present

## 2017-11-03 DIAGNOSIS — L723 Sebaceous cyst: Secondary | ICD-10-CM | POA: Diagnosis not present

## 2017-11-16 ENCOUNTER — Emergency Department (HOSPITAL_BASED_OUTPATIENT_CLINIC_OR_DEPARTMENT_OTHER)
Admission: EM | Admit: 2017-11-16 | Discharge: 2017-11-16 | Disposition: A | Discharge status: Home / Self Care | Payer: Medicare Other | Attending: Emergency Medicine | Admitting: Emergency Medicine

## 2017-11-16 ENCOUNTER — Other Ambulatory Visit: Payer: Self-pay

## 2017-11-16 ENCOUNTER — Encounter (HOSPITAL_BASED_OUTPATIENT_CLINIC_OR_DEPARTMENT_OTHER): Payer: Self-pay | Admitting: Emergency Medicine

## 2017-11-16 DIAGNOSIS — I10 Essential (primary) hypertension: Secondary | ICD-10-CM | POA: Diagnosis not present

## 2017-11-16 DIAGNOSIS — Z794 Long term (current) use of insulin: Secondary | ICD-10-CM | POA: Insufficient documentation

## 2017-11-16 DIAGNOSIS — R110 Nausea: Secondary | ICD-10-CM | POA: Insufficient documentation

## 2017-11-16 DIAGNOSIS — Z96652 Presence of left artificial knee joint: Secondary | ICD-10-CM | POA: Diagnosis not present

## 2017-11-16 DIAGNOSIS — L03319 Cellulitis of trunk, unspecified: Secondary | ICD-10-CM | POA: Diagnosis not present

## 2017-11-16 DIAGNOSIS — E119 Type 2 diabetes mellitus without complications: Secondary | ICD-10-CM | POA: Insufficient documentation

## 2017-11-16 DIAGNOSIS — Z79899 Other long term (current) drug therapy: Secondary | ICD-10-CM | POA: Diagnosis not present

## 2017-11-16 DIAGNOSIS — E039 Hypothyroidism, unspecified: Secondary | ICD-10-CM | POA: Insufficient documentation

## 2017-11-16 DIAGNOSIS — Z87891 Personal history of nicotine dependence: Secondary | ICD-10-CM | POA: Insufficient documentation

## 2017-11-16 DIAGNOSIS — R21 Rash and other nonspecific skin eruption: Secondary | ICD-10-CM | POA: Insufficient documentation

## 2017-11-16 DIAGNOSIS — I2510 Atherosclerotic heart disease of native coronary artery without angina pectoris: Secondary | ICD-10-CM | POA: Diagnosis not present

## 2017-11-16 DIAGNOSIS — L02219 Cutaneous abscess of trunk, unspecified: Secondary | ICD-10-CM | POA: Insufficient documentation

## 2017-11-16 DIAGNOSIS — M549 Dorsalgia, unspecified: Secondary | ICD-10-CM | POA: Diagnosis present

## 2017-11-16 LAB — COMPREHENSIVE METABOLIC PANEL
ALT: 20 U/L (ref 17–63)
ANION GAP: 7 (ref 5–15)
AST: 22 U/L (ref 15–41)
Albumin: 3.8 g/dL (ref 3.5–5.0)
Alkaline Phosphatase: 54 U/L (ref 38–126)
BUN: 19 mg/dL (ref 6–20)
CHLORIDE: 100 mmol/L — AB (ref 101–111)
CO2: 27 mmol/L (ref 22–32)
Calcium: 9.1 mg/dL (ref 8.9–10.3)
Creatinine, Ser: 1.22 mg/dL (ref 0.61–1.24)
GFR, EST NON AFRICAN AMERICAN: 56 mL/min — AB (ref >60)
Glucose, Bld: 105 mg/dL — ABNORMAL HIGH (ref 65–99)
POTASSIUM: 3.6 mmol/L (ref 3.5–5.1)
SODIUM: 134 mmol/L — AB (ref 135–145)
Total Bilirubin: 0.6 mg/dL (ref 0.3–1.2)
Total Protein: 7.5 g/dL (ref 6.5–8.1)

## 2017-11-16 LAB — CBC WITH DIFFERENTIAL/PLATELET
BASOS ABS: 0 10*3/uL (ref 0.0–0.1)
Basophils Relative: 0 %
EOS ABS: 0.1 10*3/uL (ref 0.0–0.7)
EOS PCT: 1 %
HCT: 45.5 % (ref 39.0–52.0)
Hemoglobin: 15.3 g/dL (ref 13.0–17.0)
LYMPHS ABS: 1.4 10*3/uL (ref 0.7–4.0)
LYMPHS PCT: 13 %
MCH: 29.4 pg (ref 26.0–34.0)
MCHC: 33.6 g/dL (ref 30.0–36.0)
MCV: 87.5 fL (ref 78.0–100.0)
MONO ABS: 1.2 10*3/uL — AB (ref 0.1–1.0)
Monocytes Relative: 11 %
Neutro Abs: 8.1 10*3/uL — ABNORMAL HIGH (ref 1.7–7.7)
Neutrophils Relative %: 75 %
PLATELETS: 160 10*3/uL (ref 150–400)
RBC: 5.2 MIL/uL (ref 4.22–5.81)
RDW: 14.2 % (ref 11.5–15.5)
WBC: 10.8 10*3/uL — AB (ref 4.0–10.5)

## 2017-11-16 LAB — CBG MONITORING, ED: GLUCOSE-CAPILLARY: 109 mg/dL — AB (ref 65–99)

## 2017-11-16 LAB — I-STAT CG4 LACTIC ACID, ED: LACTIC ACID, VENOUS: 1.05 mmol/L (ref 0.5–1.9)

## 2017-11-16 MED ORDER — CLINDAMYCIN PHOSPHATE 900 MG/50ML IV SOLN
900.0000 mg | Freq: Once | INTRAVENOUS | Status: AC
  Administered 2017-11-16: 900 mg via INTRAVENOUS
  Filled 2017-11-16: qty 50

## 2017-11-16 MED ORDER — SODIUM CHLORIDE 0.9 % IV BOLUS (SEPSIS)
1000.0000 mL | Freq: Once | INTRAVENOUS | Status: AC
  Administered 2017-11-16: 1000 mL via INTRAVENOUS

## 2017-11-16 MED ORDER — SULFAMETHOXAZOLE-TRIMETHOPRIM 800-160 MG PO TABS: 1.0000 | ORAL_TABLET | Freq: Two times a day (BID) | ORAL | 0 refills | Status: AC

## 2017-11-16 MED ORDER — CEPHALEXIN 500 MG PO CAPS: 500.0000 mg | ORAL_CAPSULE | Freq: Three times a day (TID) | ORAL | 0 refills | Status: AC

## 2017-11-16 MED ORDER — ACETAMINOPHEN 500 MG PO TABS
1000.0000 mg | ORAL_TABLET | Freq: Once | ORAL | Status: AC
  Administered 2017-11-16: 1000 mg via ORAL
  Filled 2017-11-16: qty 2
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
## 2017-11-16 NOTE — ED Notes (Signed)
Pt given Rx x 2 for keflex and septra. Pt has a ride at bedside

## 2017-11-16 NOTE — Discharge Instructions (Signed)
Like we discussed, it would be very reasonable ot admit you to the hospital.  We have traced the outline of your cellulitis.  IF YOUR PAIN IS WORSENING, AREA OF REDNESS IS SPREADING, OR IF YOU HAVE FEVER, NAUSEA, WEAKNESS, OR OTHER CONCERNING SYMPTOMS, RETURN TO THE ER IMMEDIATELY  If you continue to feel well and the rash is NOT spreading, follow-up with your primary doctor in 48 hours

## 2017-11-16 NOTE — ED Notes (Signed)
ED Provider at bedside. 

## 2017-11-16 NOTE — ED Triage Notes (Signed)
Pt c/o cyst on back; sts has been there for 20 yrs, but just recently got infected about a week ago

## 2017-11-16 NOTE — ED Notes (Signed)
ED Provider at bedside for I&D of cyst to left flank

## 2017-11-18 ENCOUNTER — Emergency Department (HOSPITAL_BASED_OUTPATIENT_CLINIC_OR_DEPARTMENT_OTHER)
Admission: EM | Admit: 2017-11-18 | Discharge: 2017-11-18 | Disposition: A | Discharge status: Home / Self Care | Payer: Medicare Other | Attending: Emergency Medicine | Admitting: Emergency Medicine

## 2017-11-18 ENCOUNTER — Encounter (HOSPITAL_BASED_OUTPATIENT_CLINIC_OR_DEPARTMENT_OTHER): Payer: Self-pay | Admitting: Emergency Medicine

## 2017-11-18 ENCOUNTER — Other Ambulatory Visit: Payer: Self-pay

## 2017-11-18 DIAGNOSIS — Z794 Long term (current) use of insulin: Secondary | ICD-10-CM | POA: Insufficient documentation

## 2017-11-18 DIAGNOSIS — E039 Hypothyroidism, unspecified: Secondary | ICD-10-CM | POA: Diagnosis not present

## 2017-11-18 DIAGNOSIS — E119 Type 2 diabetes mellitus without complications: Secondary | ICD-10-CM | POA: Diagnosis not present

## 2017-11-18 DIAGNOSIS — I2510 Atherosclerotic heart disease of native coronary artery without angina pectoris: Secondary | ICD-10-CM | POA: Diagnosis not present

## 2017-11-18 DIAGNOSIS — L03312 Cellulitis of back [any part except buttock]: Secondary | ICD-10-CM | POA: Diagnosis not present

## 2017-11-18 DIAGNOSIS — Z79899 Other long term (current) drug therapy: Secondary | ICD-10-CM | POA: Diagnosis not present

## 2017-11-18 DIAGNOSIS — I10 Essential (primary) hypertension: Secondary | ICD-10-CM | POA: Diagnosis not present

## 2017-11-18 DIAGNOSIS — Z87891 Personal history of nicotine dependence: Secondary | ICD-10-CM | POA: Diagnosis not present

## 2017-11-18 DIAGNOSIS — Z8546 Personal history of malignant neoplasm of prostate: Secondary | ICD-10-CM | POA: Insufficient documentation

## 2017-11-18 NOTE — ED Triage Notes (Signed)
Pt seen 11/18 or abscess and cellulitis. Pt has been taking abx as prescribed and told to followup in 48 hours for recheck. Pt reports itching to area. Redness is noted to be contained inside lines that are marked.

## 2017-11-18 NOTE — Discharge Instructions (Signed)
Continue antibiotics as prescribed.  Follow-up with your primary physician.  Return for any worsening of your symptoms or concerns.
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
## 2017-11-19 ENCOUNTER — Emergency Department (HOSPITAL_BASED_OUTPATIENT_CLINIC_OR_DEPARTMENT_OTHER)
Admission: EM | Admit: 2017-11-19 | Discharge: 2017-11-19 | Disposition: A | Discharge status: Home / Self Care | Payer: Medicare Other | Attending: Emergency Medicine | Admitting: Emergency Medicine

## 2017-11-19 ENCOUNTER — Encounter (HOSPITAL_BASED_OUTPATIENT_CLINIC_OR_DEPARTMENT_OTHER): Payer: Self-pay

## 2017-11-19 DIAGNOSIS — Z96652 Presence of left artificial knee joint: Secondary | ICD-10-CM | POA: Diagnosis not present

## 2017-11-19 DIAGNOSIS — Z8546 Personal history of malignant neoplasm of prostate: Secondary | ICD-10-CM | POA: Insufficient documentation

## 2017-11-19 DIAGNOSIS — Z87891 Personal history of nicotine dependence: Secondary | ICD-10-CM | POA: Insufficient documentation

## 2017-11-19 DIAGNOSIS — I10 Essential (primary) hypertension: Secondary | ICD-10-CM | POA: Diagnosis not present

## 2017-11-19 DIAGNOSIS — E039 Hypothyroidism, unspecified: Secondary | ICD-10-CM | POA: Diagnosis not present

## 2017-11-19 DIAGNOSIS — Z79899 Other long term (current) drug therapy: Secondary | ICD-10-CM | POA: Diagnosis not present

## 2017-11-19 DIAGNOSIS — E119 Type 2 diabetes mellitus without complications: Secondary | ICD-10-CM | POA: Insufficient documentation

## 2017-11-19 DIAGNOSIS — R21 Rash and other nonspecific skin eruption: Secondary | ICD-10-CM | POA: Diagnosis not present

## 2017-11-19 DIAGNOSIS — I2510 Atherosclerotic heart disease of native coronary artery without angina pectoris: Secondary | ICD-10-CM | POA: Insufficient documentation

## 2017-11-19 DIAGNOSIS — T7840XA Allergy, unspecified, initial encounter: Secondary | ICD-10-CM | POA: Insufficient documentation

## 2017-11-19 DIAGNOSIS — Z794 Long term (current) use of insulin: Secondary | ICD-10-CM | POA: Diagnosis not present

## 2017-11-19 LAB — AEROBIC CULTURE W GRAM STAIN (SUPERFICIAL SPECIMEN): Gram Stain: NONE SEEN

## 2017-11-19 LAB — AEROBIC CULTURE  (SUPERFICIAL SPECIMEN)

## 2017-11-19 MED ORDER — PREDNISONE 10 MG PO TABS
60.0000 mg | ORAL_TABLET | Freq: Once | ORAL | Status: AC
  Administered 2017-11-19: 13:00:00 60 mg via ORAL
  Filled 2017-11-19: qty 1

## 2017-11-19 MED ORDER — PREDNISONE 10 MG PO TABS: ORAL_TABLET | ORAL | 0 refills | Status: DC

## 2017-11-19 MED ORDER — DIPHENHYDRAMINE HCL 25 MG PO CAPS
25.0000 mg | ORAL_CAPSULE | Freq: Once | ORAL | Status: AC
  Administered 2017-11-19: 25 mg via ORAL
  Filled 2017-11-19: qty 1

## 2017-11-19 NOTE — Discharge Instructions (Signed)
Stop taking keflex, bactrim.   Take prednisone as prescribed.   Take benadryl 25 mg every 6 hrs for itchiness.   Return in 2 days for wound check.  Return sooner if you have trouble breathing, trouble swallowing, worse rash, fever, purulent drainage from the site.

## 2017-11-19 NOTE — ED Triage Notes (Signed)
Pt reports starting bactrim and keflex on Sunday. Woke up with swelling and itching on Monday. Continued meds, reports waking up with left eye swelling this morning, and itching that is worse.

## 2017-11-19 NOTE — ED Notes (Signed)
ED Provider at bedside. 
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
## 2017-11-20 ENCOUNTER — Telehealth: Payer: Self-pay | Admitting: *Deleted

## 2017-11-20 NOTE — Telephone Encounter (Signed)
Post ED Visit - Positive Culture Follow-up  Culture report reviewed by antimicrobial stewardship pharmacist:  []  Elenor Quinones, Pharm.D. []  Heide Guile, Pharm.D., BCPS AQ-ID []  Parks Neptune, Pharm.D., BCPS []  Alycia Rossetti, Pharm.D., BCPS []  Animas, [place].D., BCPS, AAHIVP []  Legrand Como, Pharm.D., BCPS, AAHIVP [x]  Salome Arnt, PharmD, BCPS []  Dimitri Ped, PharmD, BCPS []  Vincenza Hews, PharmD, BCPS  Positive wound culture Treated with Sulfamethoxazole-Trimethoprim, organism sensitive to the same and no further patient follow-up is required at this time.  Harlon Flor [facility] 11/20/2017, 2:19 PM

## 2017-11-21 ENCOUNTER — Other Ambulatory Visit: Payer: Self-pay

## 2017-11-21 ENCOUNTER — Emergency Department (HOSPITAL_BASED_OUTPATIENT_CLINIC_OR_DEPARTMENT_OTHER)
Admission: EM | Admit: 2017-11-21 | Discharge: 2017-11-21 | Disposition: A | Discharge status: Home / Self Care | Payer: Medicare Other | Attending: Emergency Medicine | Admitting: Emergency Medicine

## 2017-11-21 ENCOUNTER — Encounter (HOSPITAL_BASED_OUTPATIENT_CLINIC_OR_DEPARTMENT_OTHER): Payer: Self-pay | Admitting: Emergency Medicine

## 2017-11-21 DIAGNOSIS — R21 Rash and other nonspecific skin eruption: Secondary | ICD-10-CM | POA: Diagnosis present

## 2017-11-21 DIAGNOSIS — I2510 Atherosclerotic heart disease of native coronary artery without angina pectoris: Secondary | ICD-10-CM | POA: Diagnosis not present

## 2017-11-21 DIAGNOSIS — I10 Essential (primary) hypertension: Secondary | ICD-10-CM | POA: Insufficient documentation

## 2017-11-21 DIAGNOSIS — Z794 Long term (current) use of insulin: Secondary | ICD-10-CM | POA: Insufficient documentation

## 2017-11-21 DIAGNOSIS — Z79899 Other long term (current) drug therapy: Secondary | ICD-10-CM | POA: Insufficient documentation

## 2017-11-21 DIAGNOSIS — Z87891 Personal history of nicotine dependence: Secondary | ICD-10-CM | POA: Insufficient documentation

## 2017-11-21 DIAGNOSIS — L270 Generalized skin eruption due to drugs and medicaments taken internally: Secondary | ICD-10-CM

## 2017-11-21 DIAGNOSIS — L02212 Cutaneous abscess of back [any part, except buttock]: Secondary | ICD-10-CM | POA: Insufficient documentation

## 2017-11-21 DIAGNOSIS — E119 Type 2 diabetes mellitus without complications: Secondary | ICD-10-CM | POA: Diagnosis not present

## 2017-11-21 DIAGNOSIS — Z96652 Presence of left artificial knee joint: Secondary | ICD-10-CM | POA: Diagnosis not present

## 2017-11-21 DIAGNOSIS — E039 Hypothyroidism, unspecified: Secondary | ICD-10-CM | POA: Insufficient documentation

## 2017-11-21 LAB — CULTURE, BLOOD (ROUTINE X 2)
CULTURE: NO GROWTH
CULTURE: NO GROWTH
SPECIAL REQUESTS: ADEQUATE
Special Requests: ADEQUATE

## 2017-11-21 NOTE — ED Triage Notes (Signed)
Patient states that he is here today to have his rash looked at again

## 2017-11-21 NOTE — Discharge Instructions (Signed)
Please read and follow all provided instructions.  Your diagnoses today include:  1. Drug rash   2. Abscess of back    Tests performed today include:  Vital signs. See below for your results today.   Medications prescribed:   None  Take any prescribed medications only as directed.  Home care instructions:  Follow any educational materials contained in this packet.  Follow-up instructions: Please follow-up with your primary care provider in the next 3 days for further evaluation of your symptom if not improved.   Return instructions:   Please return to the Emergency Department if you experience worsening symptoms.   Please return if you have any other emergent concerns.  Additional Information:  Your vital signs today were: BP (!) 141/57 (BP Location: Right Arm)    Pulse (!) 56    Temp 98.1 F (36.7 C) (Oral)    Resp 18    Ht 5\' 8"  (1.727 m)    Wt 130.6 kg (288 lb)    SpO2 100%    BMI 43.79 kg/m  If your blood pressure (BP) was elevated above 135/85 this visit, please have this repeated by your doctor within one month. --------------
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
## 2017-11-24 DIAGNOSIS — E118 Type 2 diabetes mellitus with unspecified complications: Secondary | ICD-10-CM | POA: Diagnosis not present

## 2017-11-24 DIAGNOSIS — I10 Essential (primary) hypertension: Secondary | ICD-10-CM | POA: Diagnosis not present

## 2017-11-24 DIAGNOSIS — L089 Local infection of the skin and subcutaneous tissue, unspecified: Secondary | ICD-10-CM | POA: Diagnosis not present

## 2017-11-24 DIAGNOSIS — L723 Sebaceous cyst: Secondary | ICD-10-CM | POA: Diagnosis not present

## 2017-12-01 DIAGNOSIS — E118 Type 2 diabetes mellitus with unspecified complications: Secondary | ICD-10-CM | POA: Diagnosis not present

## 2017-12-01 DIAGNOSIS — M1990 Unspecified osteoarthritis, unspecified site: Secondary | ICD-10-CM | POA: Diagnosis not present

## 2017-12-01 DIAGNOSIS — E119 Type 2 diabetes mellitus without complications: Secondary | ICD-10-CM | POA: Diagnosis not present

## 2017-12-01 DIAGNOSIS — E039 Hypothyroidism, unspecified: Secondary | ICD-10-CM | POA: Diagnosis not present

## 2017-12-01 DIAGNOSIS — N400 Enlarged prostate without lower urinary tract symptoms: Secondary | ICD-10-CM | POA: Diagnosis not present

## 2017-12-01 DIAGNOSIS — I10 Essential (primary) hypertension: Secondary | ICD-10-CM | POA: Diagnosis not present

## 2017-12-01 DIAGNOSIS — C61 Malignant neoplasm of prostate: Secondary | ICD-10-CM | POA: Diagnosis not present

## 2017-12-01 DIAGNOSIS — E785 Hyperlipidemia, unspecified: Secondary | ICD-10-CM | POA: Diagnosis not present

## 2018-01-05 DIAGNOSIS — L723 Sebaceous cyst: Secondary | ICD-10-CM | POA: Diagnosis not present

## 2018-02-06 DIAGNOSIS — C61 Malignant neoplasm of prostate: Secondary | ICD-10-CM | POA: Diagnosis not present

## 2018-02-12 DIAGNOSIS — C61 Malignant neoplasm of prostate: Secondary | ICD-10-CM | POA: Diagnosis not present

## 2018-02-20 DIAGNOSIS — E118 Type 2 diabetes mellitus with unspecified complications: Secondary | ICD-10-CM | POA: Diagnosis not present

## 2018-02-26 DIAGNOSIS — E118 Type 2 diabetes mellitus with unspecified complications: Secondary | ICD-10-CM | POA: Diagnosis not present

## 2018-02-26 DIAGNOSIS — I10 Essential (primary) hypertension: Secondary | ICD-10-CM | POA: Diagnosis not present

## 2018-05-20 DIAGNOSIS — M25561 Pain in right knee: Secondary | ICD-10-CM | POA: Diagnosis not present

## 2018-05-20 DIAGNOSIS — E039 Hypothyroidism, unspecified: Secondary | ICD-10-CM | POA: Diagnosis not present

## 2018-05-20 DIAGNOSIS — E0789 Other specified disorders of thyroid: Secondary | ICD-10-CM | POA: Diagnosis not present

## 2018-05-20 DIAGNOSIS — E118 Type 2 diabetes mellitus with unspecified complications: Secondary | ICD-10-CM | POA: Diagnosis not present

## 2018-05-20 DIAGNOSIS — I10 Essential (primary) hypertension: Secondary | ICD-10-CM | POA: Diagnosis not present

## 2018-05-20 DIAGNOSIS — E119 Type 2 diabetes mellitus without complications: Secondary | ICD-10-CM | POA: Diagnosis not present

## 2018-05-26 DIAGNOSIS — I10 Essential (primary) hypertension: Secondary | ICD-10-CM | POA: Diagnosis not present

## 2018-05-26 DIAGNOSIS — E119 Type 2 diabetes mellitus without complications: Secondary | ICD-10-CM | POA: Diagnosis not present

## 2018-05-28 DIAGNOSIS — M25561 Pain in right knee: Secondary | ICD-10-CM | POA: Diagnosis not present

## 2018-05-28 DIAGNOSIS — M25562 Pain in left knee: Secondary | ICD-10-CM | POA: Diagnosis not present

## 2018-06-02 DIAGNOSIS — M25562 Pain in left knee: Secondary | ICD-10-CM | POA: Diagnosis not present

## 2018-06-02 DIAGNOSIS — M25561 Pain in right knee: Secondary | ICD-10-CM | POA: Diagnosis not present

## 2018-06-05 DIAGNOSIS — M25561 Pain in right knee: Secondary | ICD-10-CM | POA: Diagnosis not present

## 2018-06-05 DIAGNOSIS — M25562 Pain in left knee: Secondary | ICD-10-CM | POA: Diagnosis not present

## 2018-06-11 DIAGNOSIS — E119 Type 2 diabetes mellitus without complications: Secondary | ICD-10-CM | POA: Diagnosis not present

## 2018-06-11 DIAGNOSIS — I10 Essential (primary) hypertension: Secondary | ICD-10-CM | POA: Diagnosis not present

## 2018-06-12 DIAGNOSIS — M25562 Pain in left knee: Secondary | ICD-10-CM | POA: Diagnosis not present

## 2018-06-12 DIAGNOSIS — M25561 Pain in right knee: Secondary | ICD-10-CM | POA: Diagnosis not present

## 2018-06-19 DIAGNOSIS — M25561 Pain in right knee: Secondary | ICD-10-CM | POA: Diagnosis not present

## 2018-06-19 DIAGNOSIS — M25562 Pain in left knee: Secondary | ICD-10-CM | POA: Diagnosis not present

## 2018-06-23 DIAGNOSIS — M25561 Pain in right knee: Secondary | ICD-10-CM | POA: Diagnosis not present

## 2018-06-23 DIAGNOSIS — M25562 Pain in left knee: Secondary | ICD-10-CM | POA: Diagnosis not present

## 2018-06-26 DIAGNOSIS — M25561 Pain in right knee: Secondary | ICD-10-CM | POA: Diagnosis not present

## 2018-06-26 DIAGNOSIS — M25562 Pain in left knee: Secondary | ICD-10-CM | POA: Diagnosis not present

## 2018-06-30 DIAGNOSIS — M25561 Pain in right knee: Secondary | ICD-10-CM | POA: Diagnosis not present

## 2018-06-30 DIAGNOSIS — M25562 Pain in left knee: Secondary | ICD-10-CM | POA: Diagnosis not present

## 2018-07-03 DIAGNOSIS — M25561 Pain in right knee: Secondary | ICD-10-CM | POA: Diagnosis not present

## 2018-07-03 DIAGNOSIS — M25562 Pain in left knee: Secondary | ICD-10-CM | POA: Diagnosis not present

## 2018-07-07 DIAGNOSIS — M25562 Pain in left knee: Secondary | ICD-10-CM | POA: Diagnosis not present

## 2018-07-07 DIAGNOSIS — M25561 Pain in right knee: Secondary | ICD-10-CM | POA: Diagnosis not present

## 2018-07-10 DIAGNOSIS — M25562 Pain in left knee: Secondary | ICD-10-CM | POA: Diagnosis not present

## 2018-07-10 DIAGNOSIS — M25561 Pain in right knee: Secondary | ICD-10-CM | POA: Diagnosis not present

## 2018-07-14 DIAGNOSIS — M25561 Pain in right knee: Secondary | ICD-10-CM | POA: Diagnosis not present

## 2018-07-14 DIAGNOSIS — M25562 Pain in left knee: Secondary | ICD-10-CM | POA: Diagnosis not present

## 2018-07-17 DIAGNOSIS — M25562 Pain in left knee: Secondary | ICD-10-CM | POA: Diagnosis not present

## 2018-07-17 DIAGNOSIS — M25561 Pain in right knee: Secondary | ICD-10-CM | POA: Diagnosis not present

## 2018-08-06 ENCOUNTER — Encounter: Payer: Self-pay | Admitting: Gastroenterology

## 2018-08-10 ENCOUNTER — Encounter: Payer: Self-pay | Admitting: Gastroenterology

## 2018-08-11 DIAGNOSIS — C61 Malignant neoplasm of prostate: Secondary | ICD-10-CM | POA: Diagnosis not present

## 2018-08-18 DIAGNOSIS — R351 Nocturia: Secondary | ICD-10-CM | POA: Diagnosis not present

## 2018-08-18 DIAGNOSIS — C61 Malignant neoplasm of prostate: Secondary | ICD-10-CM | POA: Diagnosis not present

## 2018-08-18 DIAGNOSIS — N401 Enlarged prostate with lower urinary tract symptoms: Secondary | ICD-10-CM | POA: Diagnosis not present

## 2018-08-20 DIAGNOSIS — E118 Type 2 diabetes mellitus with unspecified complications: Secondary | ICD-10-CM | POA: Diagnosis not present

## 2018-08-20 DIAGNOSIS — E039 Hypothyroidism, unspecified: Secondary | ICD-10-CM | POA: Diagnosis not present

## 2018-08-20 DIAGNOSIS — I10 Essential (primary) hypertension: Secondary | ICD-10-CM | POA: Diagnosis not present

## 2018-08-20 DIAGNOSIS — E119 Type 2 diabetes mellitus without complications: Secondary | ICD-10-CM | POA: Diagnosis not present

## 2018-09-15 DIAGNOSIS — H2513 Age-related nuclear cataract, bilateral: Secondary | ICD-10-CM | POA: Diagnosis not present

## 2018-09-15 DIAGNOSIS — H524 Presbyopia: Secondary | ICD-10-CM | POA: Diagnosis not present

## 2018-09-22 ENCOUNTER — Ambulatory Visit (AMBULATORY_SURGERY_CENTER): Payer: Self-pay

## 2018-09-22 ENCOUNTER — Telehealth: Payer: Self-pay

## 2018-09-22 VITALS — Ht 68.0 in | Wt 302.8 lb

## 2018-09-22 DIAGNOSIS — Z1211 Encounter for screening for malignant neoplasm of colon: Secondary | ICD-10-CM

## 2018-09-22 MED ORDER — PEG 3350-KCL-NA BICARB-NACL 420 G PO SOLR: 4000.0000 mL | Freq: Once | ORAL | 0 refills | Status: AC

## 2018-09-22 NOTE — Telephone Encounter (Signed)
[name] [name], MRN [id number] was seen in [facility] 09/22/18/. Patient states that he uses an insulin pump to manage his diabetes. Patient no longer takes Lantus but is on Metformin and Novolog. Patient understands instructions for Metformin. Please contact Dr. Jani Gravel who manages his pump for instructions regarding insulin during his colonoscopy. Patient was instructed to follow up with our office if he has not heard anything prior to his procedure. Thanks!  Riki Sheer, LPN

## 2018-09-22 NOTE — Progress Notes (Signed)
Denies allergies to eggs or soy products. Denies complication of anesthesia or sedation. Denies use of weight loss medication. Denies use of O2.   Emmi instructions declined.   Patient states that he is no longer on Lantus but is on an insulin pump. A note will be sent to Dr. Silvio Pate CMA to follow up with Dr. Jani Gravel regarding instructions for insulin pump for his colonoscopy. Patient was instructed to call Dr. Silvio Pate office if he has not heard anything prior to his procedure. Patient verbalizes understanding.

## 2018-09-22 NOTE — Telephone Encounter (Signed)
Faxed insulin pump letter to Dr. Julianne Rice office and waiting on response.

## 2018-10-02 NOTE — Telephone Encounter (Signed)
Left a message with Dr. Julianne Rice office to get a response.

## 2018-10-05 ENCOUNTER — Telehealth: Payer: Self-pay | Admitting: Gastroenterology

## 2018-10-05 NOTE — Telephone Encounter (Signed)
See previous phone note on 09/22/18

## 2018-10-05 NOTE — Telephone Encounter (Signed)
Spoke with pharmacist at [facility] and she states she will call and advise patient on what to do with insulin pump letter.

## 2018-10-05 NOTE — Telephone Encounter (Signed)
Called patient to ask if Dr. Julianne Rice office contacted him regarding his insulin pump. Patient states he has not received any calls from Dr. Julianne Rice office.

## 2018-10-05 NOTE — Telephone Encounter (Signed)
Called Dr. Julianne Rice office and nurse states she has been trying to contact patient for a while and has left several messages regarding insulin pump. Informed Dr. Julianne Rice office I will also contact patient and let him know to answer when there office calls him or we will have to reschedule the procedure if he does not adjust his pump. Dr. Julianne Rice nurse states she will try again to contact patient.

## 2018-10-05 NOTE — Telephone Encounter (Signed)
Called patient and he states he will call Dr. Julianne Rice office right now to find out what to do about his pump for tomorrows procedure. Asked patient to call me back to let me know he spoke with someone and understands what to do.

## 2018-10-06 ENCOUNTER — Ambulatory Visit (AMBULATORY_SURGERY_CENTER): Payer: Medicare Other | Admitting: Gastroenterology

## 2018-10-06 ENCOUNTER — Encounter: Payer: Self-pay | Admitting: Gastroenterology

## 2018-10-06 VITALS — BP 139/59 | HR 57 | Temp 97.5°F | Resp 17 | Ht 68.0 in | Wt 302.0 lb

## 2018-10-06 DIAGNOSIS — D122 Benign neoplasm of ascending colon: Secondary | ICD-10-CM | POA: Diagnosis not present

## 2018-10-06 DIAGNOSIS — Z1211 Encounter for screening for malignant neoplasm of colon: Secondary | ICD-10-CM

## 2018-10-06 DIAGNOSIS — D125 Benign neoplasm of sigmoid colon: Secondary | ICD-10-CM

## 2018-10-06 DIAGNOSIS — D124 Benign neoplasm of descending colon: Secondary | ICD-10-CM | POA: Diagnosis not present

## 2018-10-06 DIAGNOSIS — G4733 Obstructive sleep apnea (adult) (pediatric): Secondary | ICD-10-CM | POA: Diagnosis not present

## 2018-10-06 DIAGNOSIS — I739 Peripheral vascular disease, unspecified: Secondary | ICD-10-CM | POA: Diagnosis not present

## 2018-10-06 DIAGNOSIS — E119 Type 2 diabetes mellitus without complications: Secondary | ICD-10-CM | POA: Diagnosis not present

## 2018-10-06 DIAGNOSIS — I10 Essential (primary) hypertension: Secondary | ICD-10-CM | POA: Diagnosis not present

## 2018-10-06 MED ORDER — SODIUM CHLORIDE 0.9 % IV SOLN: 500.0000 mL | Freq: Once | INTRAVENOUS | Status: DC

## 2018-10-06 NOTE — Op Note (Signed)
[place] Patient Name: [name] [name] Procedure Date: 10/06/2018 8:53 AM MRN: [id number] Endoscopist: Ladene Artist , MD Age: 77 Referring MD:  Date of Birth: 1941-02-11 Gender: Male Account #: [id number] Procedure:                Colonoscopy Indications:              Screening for colorectal malignant neoplasm Medicines:                Monitored Anesthesia Care Procedure:                Pre-Anesthesia Assessment:                           - Prior to the procedure, a History and Physical                            was performed, and patient medications and                            allergies were reviewed. The patient's tolerance of                            previous anesthesia was also reviewed. The risks                            and benefits of the procedure and the sedation                            options and risks were discussed with the patient.                            All questions were answered, and informed consent                            was obtained. Prior Anticoagulants: The patient has                            taken no previous anticoagulant or antiplatelet                            agents. ASA Grade Assessment: III - A patient with                            severe systemic disease. After reviewing the risks                            and benefits, the patient was deemed in                            satisfactory condition to undergo the procedure.                           After obtaining informed consent, the colonoscope  was passed under direct vision. Throughout the                            procedure, the patient's blood pressure, pulse, and                            oxygen saturations were monitored continuously. The                            Colonoscope was introduced through the anus and                            advanced to the the cecum, identified by                            appendiceal orifice and  ileocecal valve. The                            ileocecal valve, appendiceal orifice, and rectum                            were photographed. The quality of the bowel                            preparation was good. The colonoscopy was performed                            without difficulty. The patient tolerated the                            procedure well. Scope In: 9:03:17 AM Scope Out: 9:20:53 AM Scope Withdrawal Time: 0 hours 14 minutes 36 seconds  Total Procedure Duration: 0 hours 17 minutes 36 seconds  Findings:                 The perianal and digital rectal examinations were                            normal.                           A 15 mm polyp was found in the descending colon.                            The polyp was sessile. The polyp was removed with a                            hot snare. Resection and retrieval were complete.                           Two sessile polyps were found in the sigmoid colon                            and ascending colon. The polyps were 7 to 8 mm in  size. These polyps were removed with a cold snare.                            Resection and retrieval were complete.                           A single small localized angiodysplastic lesion                            without bleeding was found in the cecum.                           A few small-mouthed diverticula were found in the                            left colon. There was no evidence of diverticular                            bleeding.                           Internal hemorrhoids were found during                            retroflexion. The hemorrhoids were small and Grade                            I (internal hemorrhoids that do not prolapse).                           The exam was otherwise without abnormality on                            direct and retroflexion views. Complications:            No immediate complications. Estimated blood loss:                             None. Estimated Blood Loss:     Estimated blood loss: none. Impression:               - One 15 mm polyp in the descending colon, removed                            with a hot snare. Resected and retrieved.                           - Two 7 to 8 mm polyps in the sigmoid colon and in                            the ascending colon, removed with a cold snare.                            Resected and retrieved.                           -  Cecal angiodysplasia.                           - Mild diverticulosis in the left colon.                           - Internal hemorrhoids.                           - The examination was otherwise normal on direct                            and retroflexion views. Recommendation:           - Repeat colonoscopy date to be determined after                            pending pathology results are reviewed for                            surveillance.                           - Patient has a contact number available for                            emergencies. The signs and symptoms of potential                            delayed complications were discussed with the                            patient. Return to normal activities tomorrow.                            Written discharge instructions were provided to the                            patient.                           - Resume previous diet.                           - Continue present medications.                           - Await pathology results.                           - No aspirin, ibuprofen, naproxen, or other                            non-steroidal anti-inflammatory drugs for 2 weeks                            after polyp removal. Ladene Artist, MD 10/06/2018 9:25:52 AM This report has  been signed electronically.

## 2018-10-06 NOTE — Progress Notes (Signed)
Called to room to assist during endoscopic procedure.  Patient ID and intended procedure confirmed with present staff. Received instructions for my participation in the procedure from the performing physician.  

## 2018-10-06 NOTE — Progress Notes (Signed)
Report to PACU, RN, vss, BBS= Clear.  

## 2018-10-06 NOTE — Patient Instructions (Signed)
YOU HAD AN ENDOSCOPIC PROCEDURE TODAY AT [facility]:   Refer to the procedure report that was given to you for any specific questions about what was found during the examination.  If the procedure report does not answer your questions, please call your gastroenterologist to clarify.  If you requested that your care partner not be given the details of your procedure findings, then the procedure report has been included in a sealed envelope for you to review at your convenience later.  YOU SHOULD EXPECT: Some feelings of bloating in the abdomen. Passage of more gas than usual.  Walking can help get rid of the air that was put into your GI tract during the procedure and reduce the bloating. If you had a lower endoscopy (such as a colonoscopy or flexible sigmoidoscopy) you may notice spotting of blood in your stool or on the toilet paper. If you underwent a bowel prep for your procedure, you may not have a normal bowel movement for a few days.  Please Note:  You might notice some irritation and congestion in your nose or some drainage.  This is from the oxygen used during your procedure.  There is no need for concern and it should clear up in a day or so.  SYMPTOMS TO REPORT IMMEDIATELY:   Following lower endoscopy (colonoscopy or flexible sigmoidoscopy):  Excessive amounts of blood in the stool  Significant tenderness or worsening of abdominal pains  Swelling of the abdomen that is new, acute  Fever of 100F or higher  Please see handouts given to you on Polyps and Hemorrhoids. No NSaids for 2 weeks. Tylenol is ok! For urgent or emergent issues, a gastroenterologist can be reached at any hour by calling [phone].   DIET:  We do recommend a small meal at first, but then you may proceed to your regular diet.  Drink plenty of fluids but you should avoid alcoholic beverages for 24 hours.  ACTIVITY:  You should plan to take it easy for the rest of today and you should NOT DRIVE  or use heavy machinery until tomorrow (because of the sedation medicines used during the test).    FOLLOW UP: Our staff will call the number listed on your records the next business day following your procedure to check on you and address any questions or concerns that you may have regarding the information given to you following your procedure. If we do not reach you, we will leave a message.  However, if you are feeling well and you are not experiencing any problems, there is no need to return our call.  We will assume that you have returned to your regular daily activities without incident.  If any biopsies were taken you will be contacted by phone or by letter within the next 1-3 weeks.  Please call us at [phone] if you have not heard about the biopsies in 3 weeks.    SIGNATURES/CONFIDENTIALITY: You and/or your care partner have signed paperwork which will be entered into your electronic medical record.  These signatures attest to the fact that that the information above on your After Visit Summary has been reviewed and is understood.  Full responsibility of the confidentiality of this discharge information lies with you and/or your care-partner.  Thank you for letting us take care of your healthcare needs today.

## 2018-10-07 ENCOUNTER — Telehealth: Payer: Self-pay

## 2018-10-07 NOTE — Telephone Encounter (Signed)
  Follow up Call-  Call back number 10/06/2018  Post procedure Call Back phone  # [phone]  Permission to leave phone message Yes  Some recent data might be hidden     Patient questions:  Do you have a fever, pain , or abdominal swelling? No. Pain Score  0 *  Have you tolerated food without any problems? Yes.    Have you been able to return to your normal activities? Yes.    Do you have any questions about your discharge instructions: Diet   No. Medications  No. Follow up visit  No.  Do you have questions or concerns about your Care? No.  Actions: * If pain score is 4 or above: No action needed, pain <4.

## 2018-10-07 NOTE — Telephone Encounter (Signed)
NO ANSWER, MESSAGE LEFT FOR PATIENT. 

## 2018-10-20 ENCOUNTER — Encounter: Payer: Self-pay | Admitting: Gastroenterology

## 2018-11-16 DIAGNOSIS — E119 Type 2 diabetes mellitus without complications: Secondary | ICD-10-CM | POA: Diagnosis not present

## 2018-11-19 DIAGNOSIS — E785 Hyperlipidemia, unspecified: Secondary | ICD-10-CM | POA: Diagnosis not present

## 2018-11-19 DIAGNOSIS — E118 Type 2 diabetes mellitus with unspecified complications: Secondary | ICD-10-CM | POA: Diagnosis not present

## 2018-11-19 DIAGNOSIS — I10 Essential (primary) hypertension: Secondary | ICD-10-CM | POA: Diagnosis not present

## 2018-12-03 DIAGNOSIS — E785 Hyperlipidemia, unspecified: Secondary | ICD-10-CM | POA: Diagnosis not present

## 2018-12-03 DIAGNOSIS — I10 Essential (primary) hypertension: Secondary | ICD-10-CM | POA: Diagnosis not present

## 2018-12-03 DIAGNOSIS — Z23 Encounter for immunization: Secondary | ICD-10-CM | POA: Diagnosis not present

## 2018-12-03 DIAGNOSIS — E118 Type 2 diabetes mellitus with unspecified complications: Secondary | ICD-10-CM | POA: Diagnosis not present

## 2018-12-31 DIAGNOSIS — R0789 Other chest pain: Secondary | ICD-10-CM | POA: Diagnosis not present

## 2018-12-31 DIAGNOSIS — R079 Chest pain, unspecified: Secondary | ICD-10-CM | POA: Diagnosis not present

## 2018-12-31 DIAGNOSIS — I10 Essential (primary) hypertension: Secondary | ICD-10-CM | POA: Diagnosis not present

## 2018-12-31 DIAGNOSIS — E118 Type 2 diabetes mellitus with unspecified complications: Secondary | ICD-10-CM | POA: Diagnosis not present

## 2018-12-31 DIAGNOSIS — I469 Cardiac arrest, cause unspecified: Secondary | ICD-10-CM | POA: Diagnosis not present

## 2018-12-31 DIAGNOSIS — I499 Cardiac arrhythmia, unspecified: Secondary | ICD-10-CM | POA: Diagnosis not present

## 2019-01-30 DIAGNOSIS — 419620001 Death: Secondary | SNOMED CT | POA: Diagnosis not present

## 2019-01-30 DEATH — deceased
